# Patient Record
Sex: Male | Born: 1935 | Race: White | Hispanic: No | Smoking: Never smoker
Health system: Southern US, Community
[De-identification: ages and names within clinical notes are randomized; demographics above are authoritative.]

## PROBLEM LIST (undated history)

## (undated) DIAGNOSIS — I255 Ischemic cardiomyopathy: Secondary | ICD-10-CM

## (undated) DIAGNOSIS — E785 Hyperlipidemia, unspecified: Secondary | ICD-10-CM

## (undated) DIAGNOSIS — I2510 Atherosclerotic heart disease of native coronary artery without angina pectoris: Secondary | ICD-10-CM

## (undated) DIAGNOSIS — IMO0001 Reserved for inherently not codable concepts without codable children: Secondary | ICD-10-CM

## (undated) DIAGNOSIS — R0989 Other specified symptoms and signs involving the circulatory and respiratory systems: Secondary | ICD-10-CM

## (undated) DIAGNOSIS — I509 Heart failure, unspecified: Secondary | ICD-10-CM

## (undated) DIAGNOSIS — E669 Obesity, unspecified: Secondary | ICD-10-CM

## (undated) DIAGNOSIS — I5022 Chronic systolic (congestive) heart failure: Secondary | ICD-10-CM

## (undated) HISTORY — DX: Obesity, unspecified: E66.9

## (undated) HISTORY — PX: SHOULDER SURGERY: SHX246

## (undated) HISTORY — PX: KNEE SURGERY: SHX244

## (undated) HISTORY — DX: Heart failure, unspecified: I50.9

## (undated) HISTORY — DX: Atherosclerotic heart disease of native coronary artery without angina pectoris: I25.10

## (undated) HISTORY — PX: APPENDECTOMY: SHX54

## (undated) HISTORY — DX: Other specified symptoms and signs involving the circulatory and respiratory systems: R09.89

## (undated) HISTORY — DX: Reserved for inherently not codable concepts without codable children: IMO0001

## (undated) HISTORY — DX: Hyperlipidemia, unspecified: E78.5

## (undated) HISTORY — DX: Chronic systolic (congestive) heart failure: I50.22

## (undated) HISTORY — PX: BACK SURGERY: SHX140

## (undated) HISTORY — PX: FRACTURE SURGERY: SHX138

## (undated) HISTORY — DX: Ischemic cardiomyopathy: I25.5

---

## 2006-09-10 ENCOUNTER — Ambulatory Visit: Payer: Self-pay | Admitting: General Surgery

## 2007-11-12 ENCOUNTER — Ambulatory Visit: Payer: Self-pay | Admitting: Oncology

## 2007-11-13 ENCOUNTER — Encounter: Payer: Self-pay | Admitting: Cardiovascular Disease

## 2007-11-14 ENCOUNTER — Ambulatory Visit: Payer: Self-pay | Admitting: Oncology

## 2007-11-18 ENCOUNTER — Inpatient Hospital Stay: Payer: Self-pay | Admitting: Internal Medicine

## 2007-11-21 ENCOUNTER — Ambulatory Visit: Payer: Self-pay | Admitting: Thoracic Surgery

## 2007-11-21 ENCOUNTER — Inpatient Hospital Stay (HOSPITAL_COMMUNITY): Admission: AD | Admit: 2007-11-21 | Discharge: 2007-11-28 | Payer: Self-pay | Admitting: Thoracic Surgery

## 2007-11-22 ENCOUNTER — Encounter: Payer: Self-pay | Admitting: Thoracic Surgery

## 2007-11-24 ENCOUNTER — Ambulatory Visit: Payer: Self-pay | Admitting: Internal Medicine

## 2007-12-04 ENCOUNTER — Ambulatory Visit: Payer: Self-pay | Admitting: Thoracic Surgery

## 2007-12-04 ENCOUNTER — Encounter: Admission: RE | Admit: 2007-12-04 | Discharge: 2007-12-04 | Payer: Self-pay | Admitting: Thoracic Surgery

## 2007-12-08 ENCOUNTER — Emergency Department (HOSPITAL_COMMUNITY): Admission: EM | Admit: 2007-12-08 | Discharge: 2007-12-08 | Payer: Self-pay | Admitting: Emergency Medicine

## 2007-12-09 ENCOUNTER — Ambulatory Visit: Payer: Self-pay | Admitting: Thoracic Surgery

## 2007-12-09 ENCOUNTER — Encounter: Admission: RE | Admit: 2007-12-09 | Discharge: 2007-12-09 | Payer: Self-pay | Admitting: Thoracic Surgery

## 2007-12-12 ENCOUNTER — Ambulatory Visit: Payer: Self-pay | Admitting: Oncology

## 2007-12-17 ENCOUNTER — Ambulatory Visit: Payer: Self-pay | Admitting: Thoracic Surgery

## 2007-12-17 ENCOUNTER — Encounter: Admission: RE | Admit: 2007-12-17 | Discharge: 2007-12-17 | Payer: Self-pay | Admitting: Thoracic Surgery

## 2007-12-25 ENCOUNTER — Encounter: Admission: RE | Admit: 2007-12-25 | Discharge: 2007-12-25 | Payer: Self-pay | Admitting: Thoracic Surgery

## 2007-12-25 ENCOUNTER — Ambulatory Visit: Payer: Self-pay | Admitting: Thoracic Surgery

## 2008-01-01 ENCOUNTER — Ambulatory Visit: Payer: Self-pay | Admitting: Thoracic Surgery

## 2008-01-15 ENCOUNTER — Encounter: Admission: RE | Admit: 2008-01-15 | Discharge: 2008-01-15 | Payer: Self-pay | Admitting: Thoracic Surgery

## 2008-01-15 ENCOUNTER — Ambulatory Visit: Payer: Self-pay | Admitting: Thoracic Surgery

## 2008-02-05 ENCOUNTER — Ambulatory Visit: Payer: Self-pay | Admitting: Thoracic Surgery

## 2008-03-16 ENCOUNTER — Encounter: Admission: RE | Admit: 2008-03-16 | Discharge: 2008-03-16 | Payer: Self-pay | Admitting: Surgery

## 2008-03-16 ENCOUNTER — Ambulatory Visit: Payer: Self-pay | Admitting: Thoracic Surgery

## 2009-01-12 IMAGING — CR DG CHEST 2V
2 series · 2 of 2 positions shown · non-contrast
Comparison: 11/28/2007

CLINICAL DATA: Empyema with indwelling chest tubes

CHEST - 2 VIEW

[w chest ap]
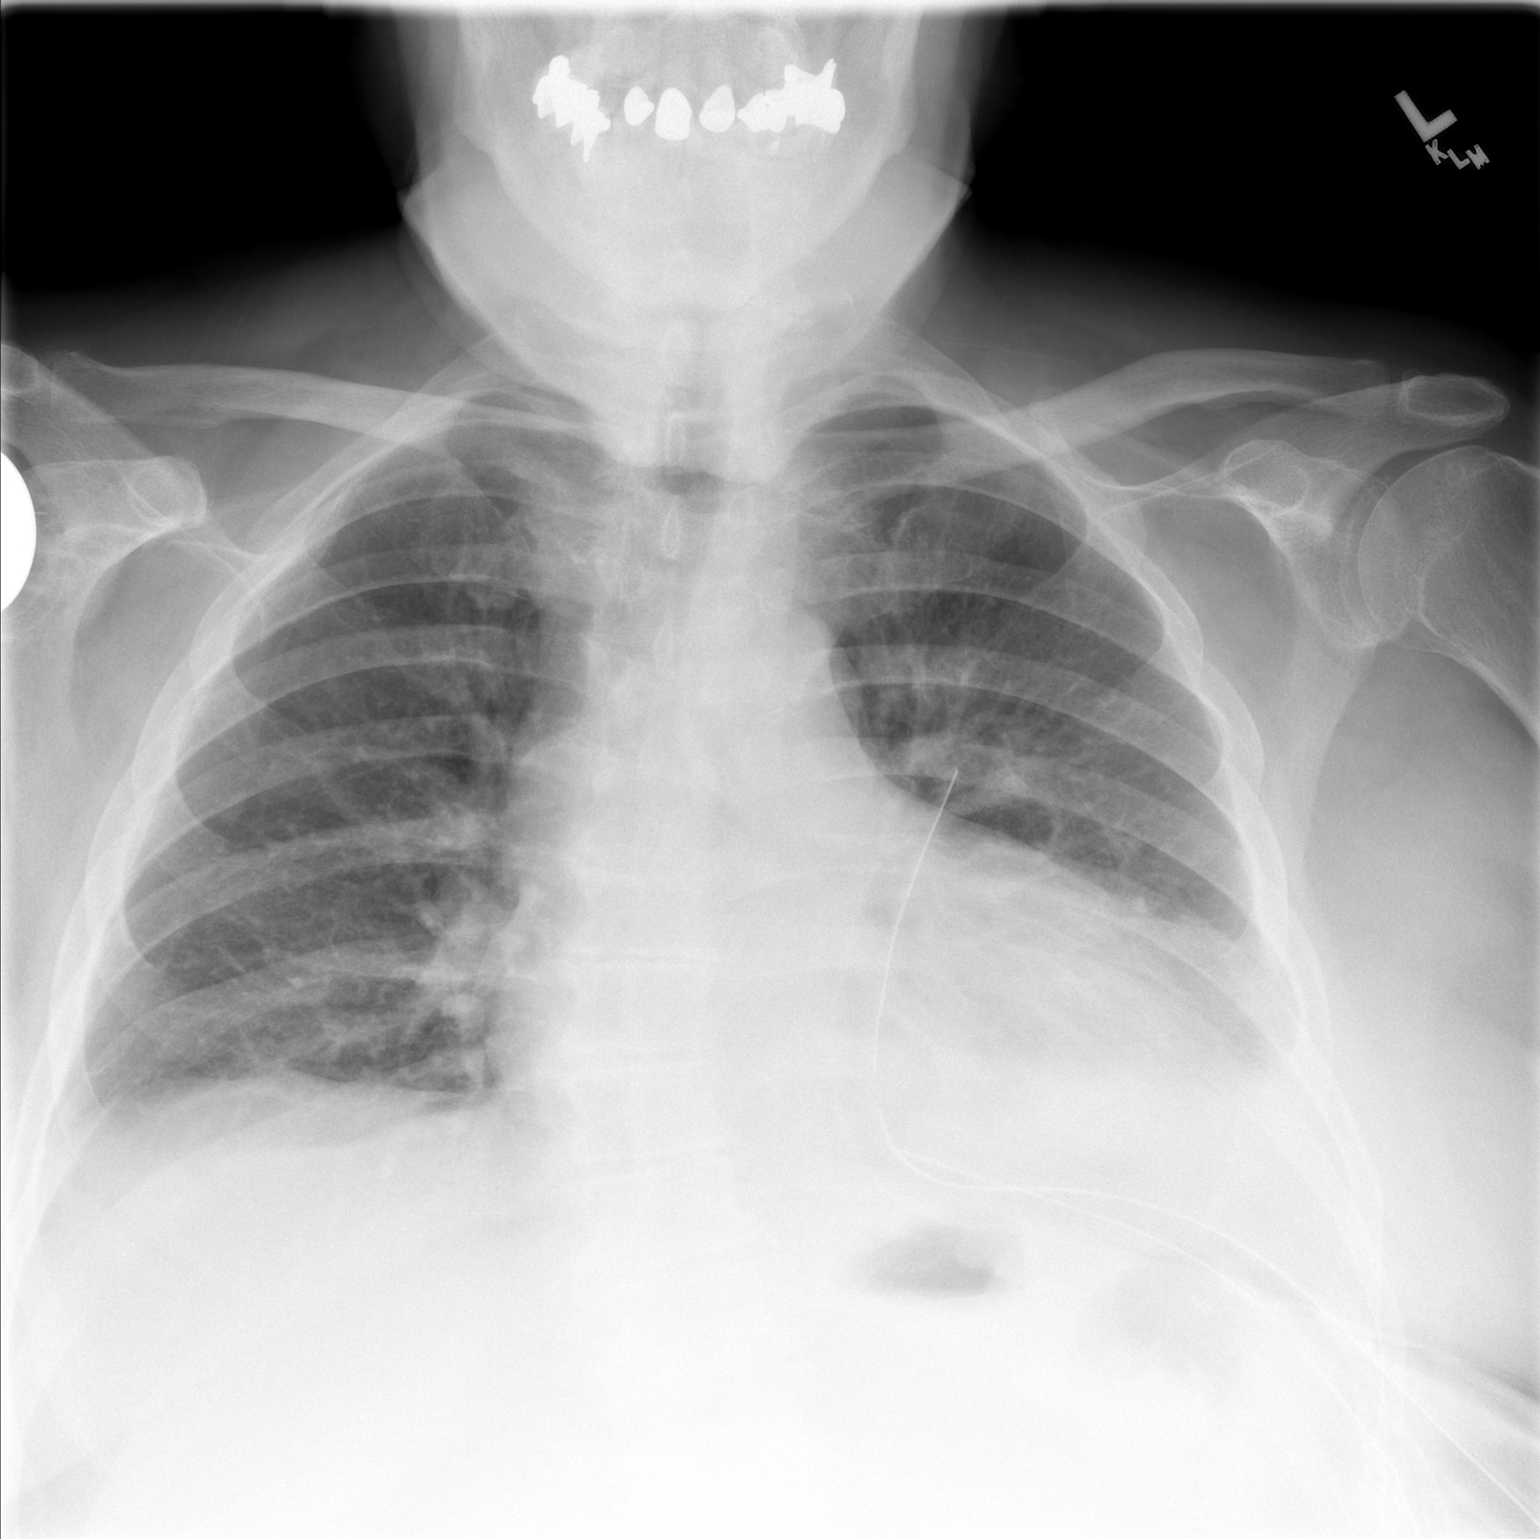

[w chest lat]
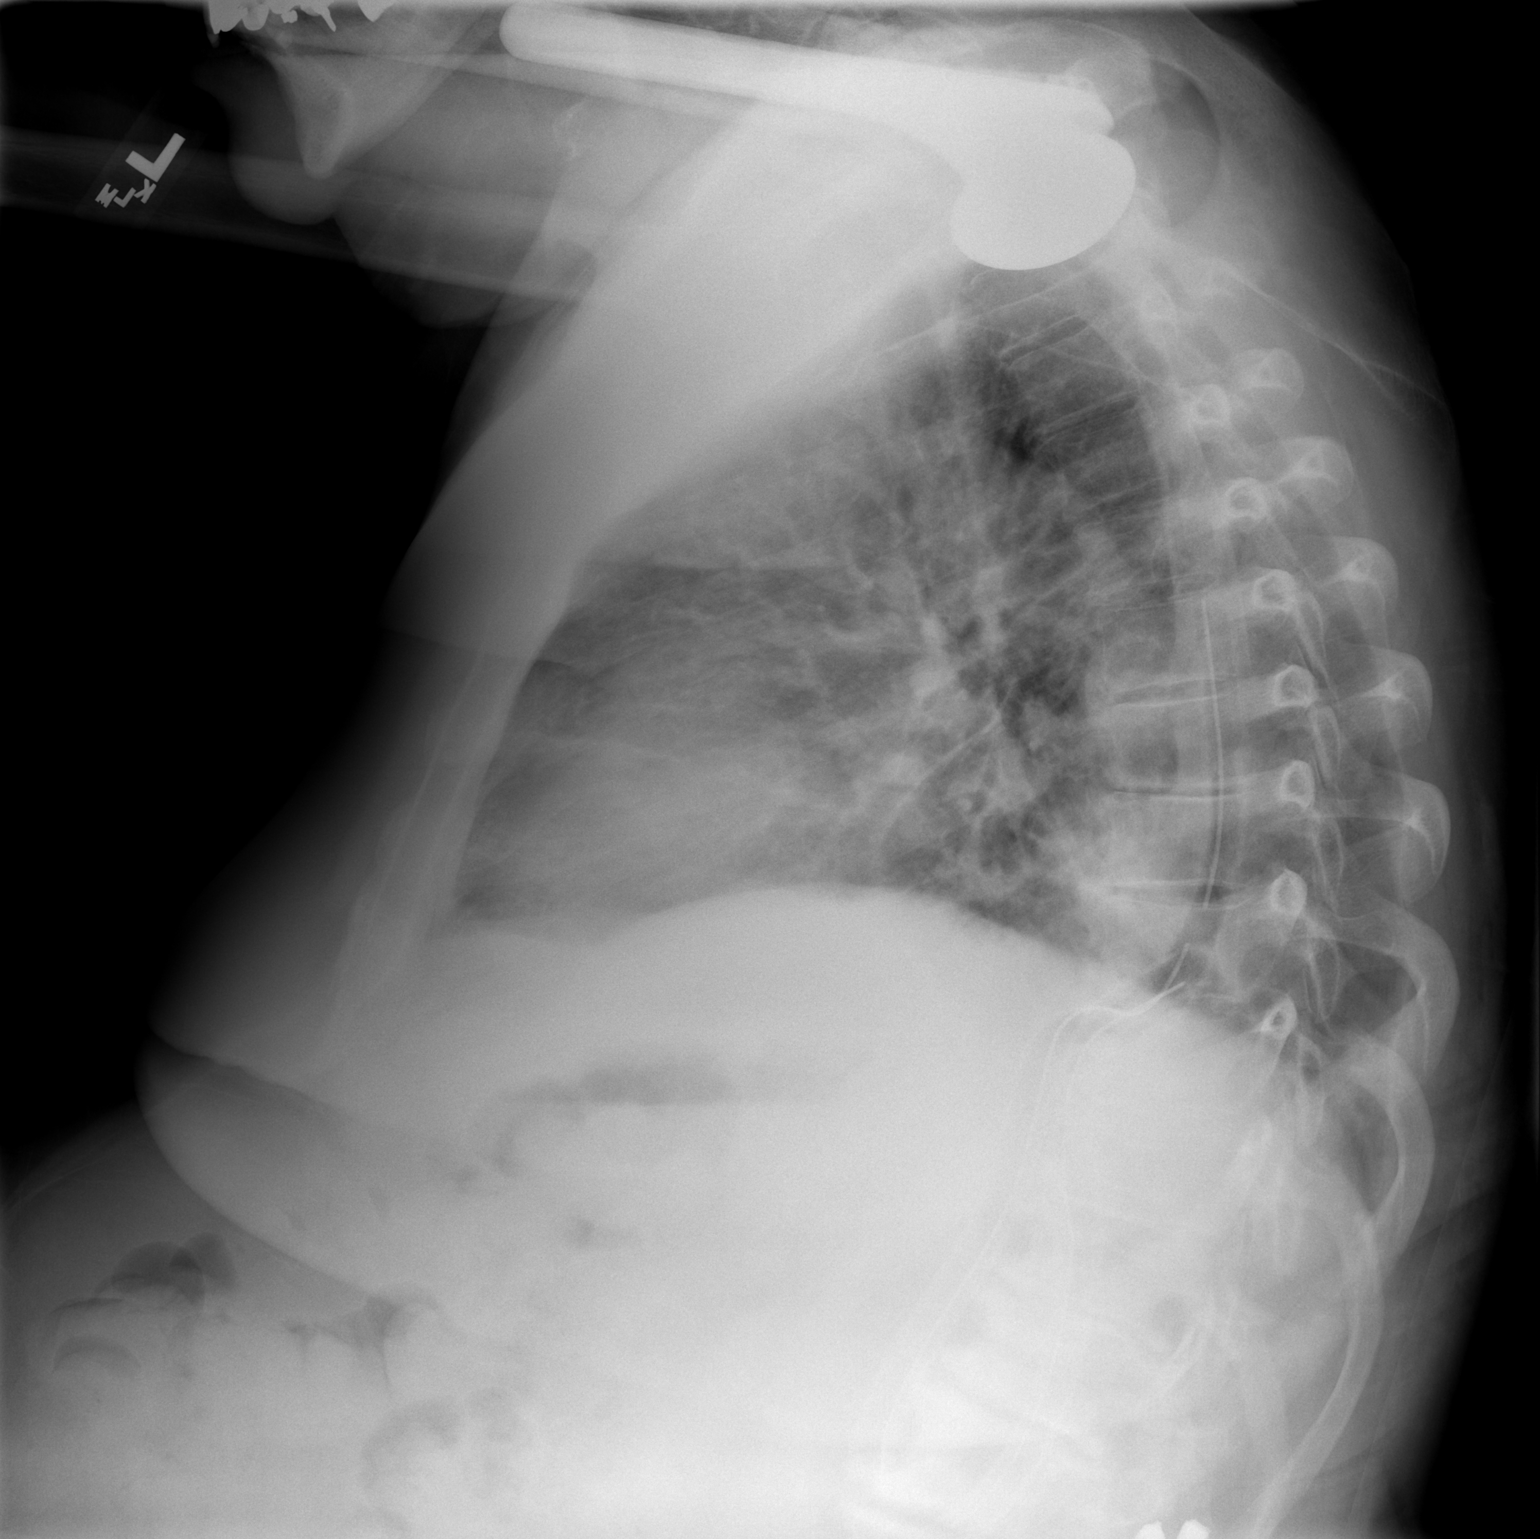

[2 of 2 positions shown; findings below may reference images not displayed]

FINDINGS: To left chest tube remain in place.  No pneumothorax or
significant empyema.  Heart enlarged without definite congestive
heart failure.  No major atelectasis or pneumonia.  Overall, no
significant change.
IMPRESSION: 1.
1.  No pneumothorax thorax or recurrence of empyema with two  left
chest tubes remaining in place.
2.  Heart enlarged - no change.
3.  No new findings.

## 2009-01-17 IMAGING — CR DG CHEST 2V
2 series · 2 of 2 positions shown · non-contrast
Comparison: 12/08/2007

CLINICAL DATA: Chest tube pulled out by accident

CHEST - 2 VIEW

[view not recorded (1 of 2)]
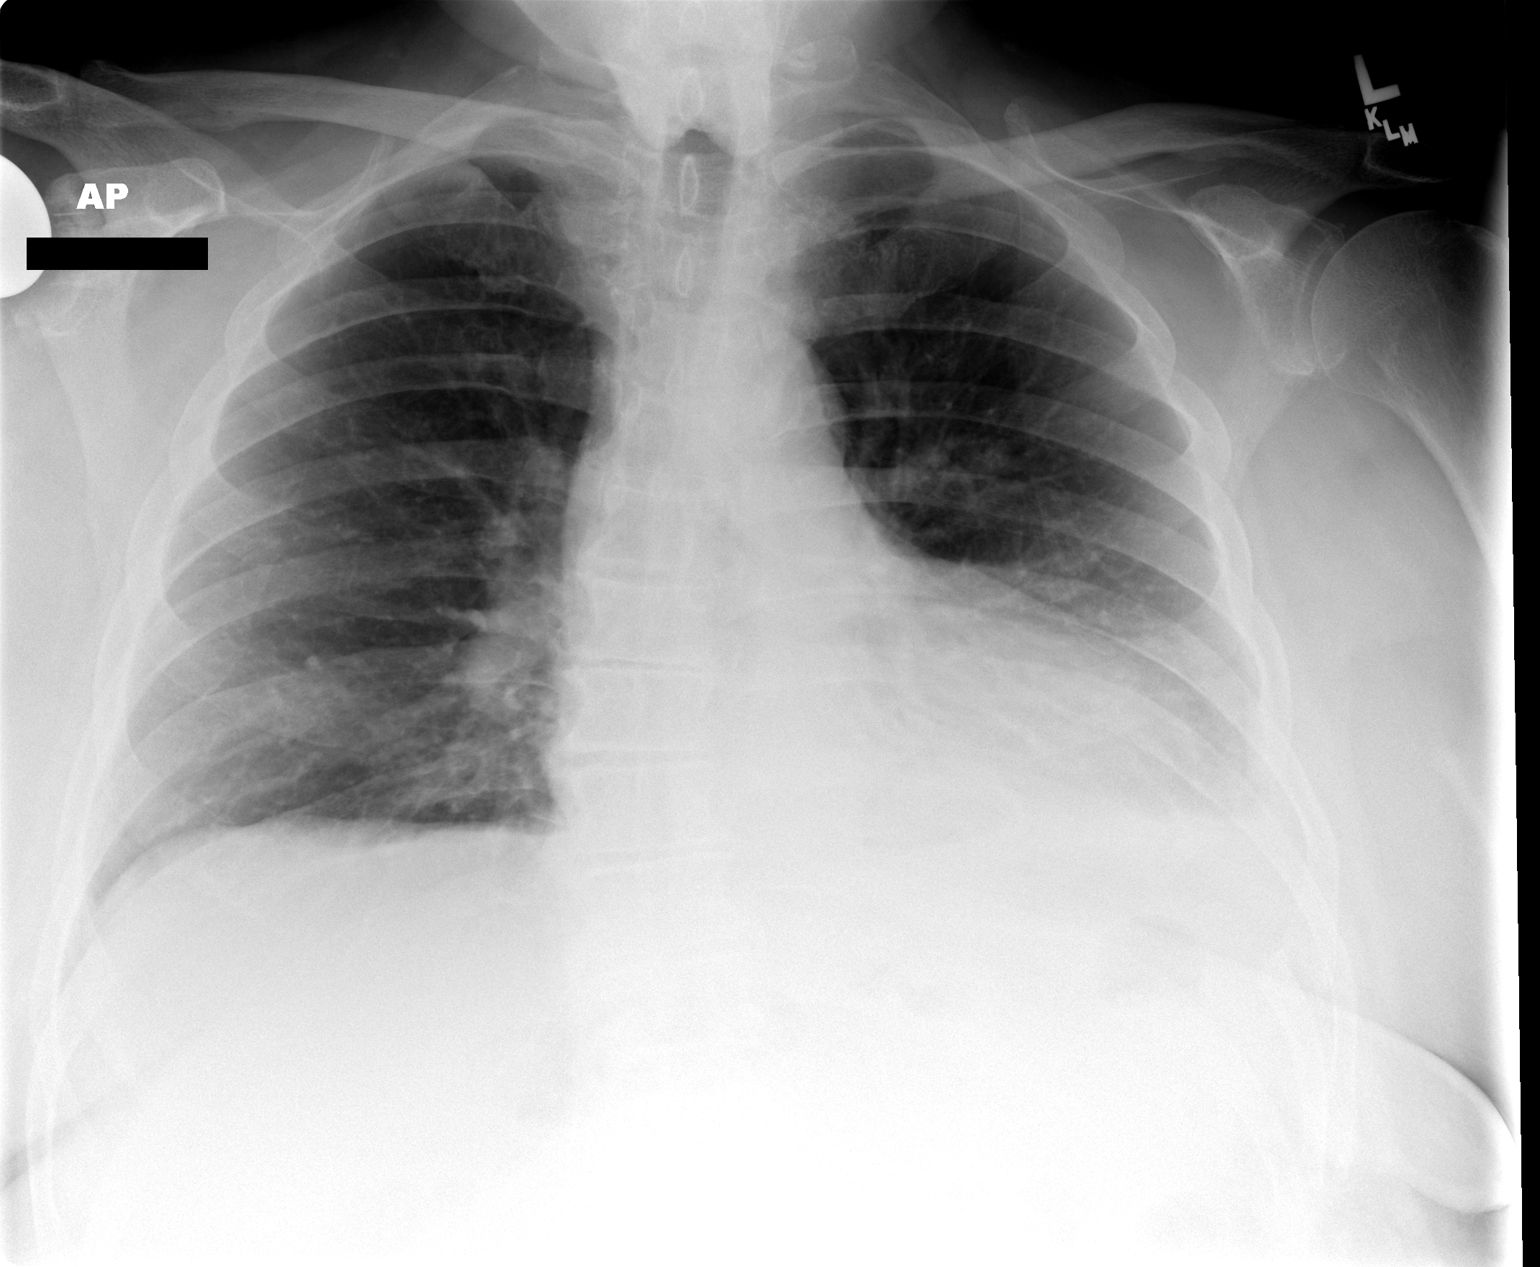

[view not recorded (2 of 2)]
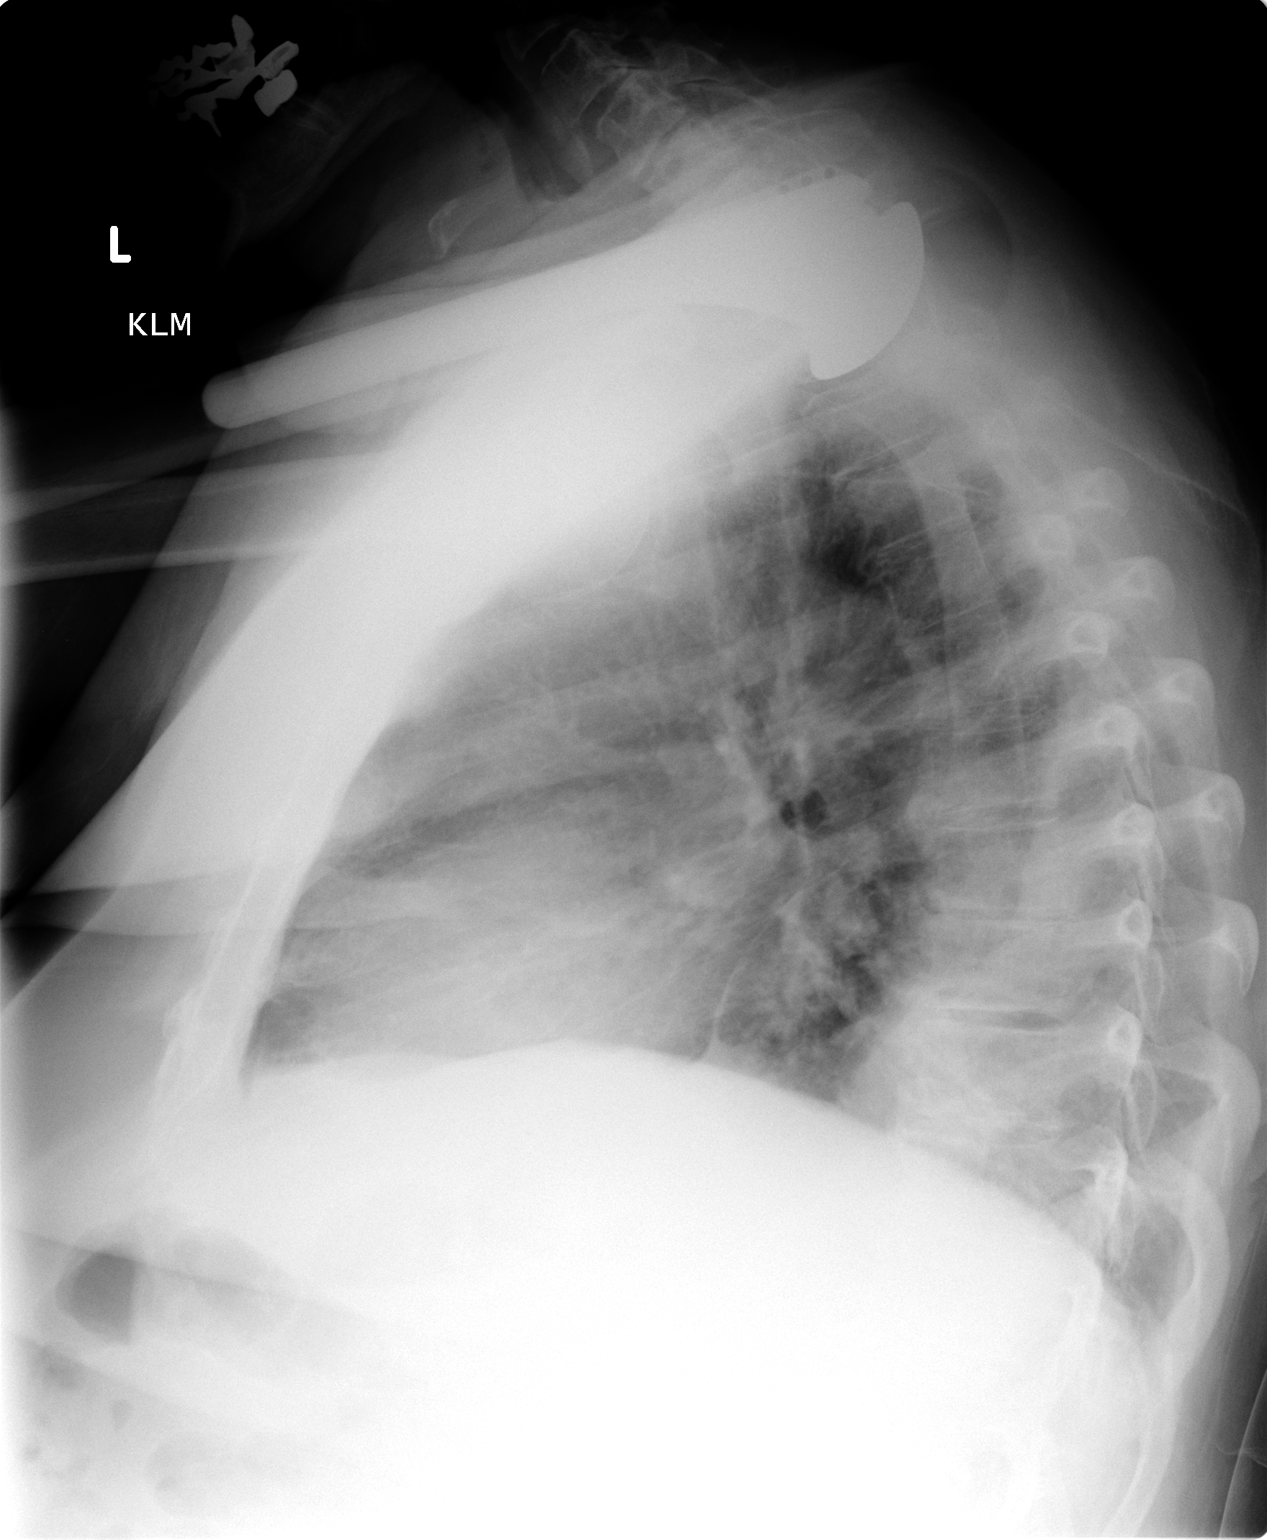

[2 of 2 positions shown; findings below may reference images not displayed]

FINDINGS: Moderate small left pleural effusion.  No pneumothorax.
IMPRESSION: No significant change in left pleural effusion.

## 2009-08-30 ENCOUNTER — Encounter: Payer: Self-pay | Admitting: Cardiovascular Disease

## 2009-12-26 ENCOUNTER — Ambulatory Visit: Payer: Self-pay | Admitting: Internal Medicine

## 2009-12-26 ENCOUNTER — Encounter: Payer: Self-pay | Admitting: Cardiovascular Disease

## 2010-01-25 ENCOUNTER — Encounter: Payer: Self-pay | Admitting: Cardiovascular Disease

## 2010-01-25 ENCOUNTER — Ambulatory Visit: Payer: Self-pay | Admitting: Internal Medicine

## 2010-02-02 ENCOUNTER — Ambulatory Visit: Payer: Self-pay | Admitting: Cardiovascular Disease

## 2010-02-02 DIAGNOSIS — R609 Edema, unspecified: Secondary | ICD-10-CM | POA: Insufficient documentation

## 2010-02-02 DIAGNOSIS — R0602 Shortness of breath: Secondary | ICD-10-CM | POA: Insufficient documentation

## 2010-02-07 ENCOUNTER — Ambulatory Visit: Payer: Self-pay

## 2010-02-07 ENCOUNTER — Encounter: Payer: Self-pay | Admitting: Cardiovascular Disease

## 2010-02-09 ENCOUNTER — Ambulatory Visit: Payer: Self-pay | Admitting: Cardiovascular Disease

## 2010-02-09 DIAGNOSIS — E785 Hyperlipidemia, unspecified: Secondary | ICD-10-CM

## 2010-02-10 ENCOUNTER — Other Ambulatory Visit: Payer: Self-pay | Admitting: Cardiovascular Disease

## 2010-02-10 ENCOUNTER — Encounter: Payer: Self-pay | Admitting: Cardiovascular Disease

## 2010-02-14 HISTORY — PX: CORONARY ARTERY BYPASS GRAFT: SHX141

## 2010-02-15 ENCOUNTER — Ambulatory Visit: Payer: Self-pay | Admitting: Cardiovascular Disease

## 2010-02-15 ENCOUNTER — Inpatient Hospital Stay (HOSPITAL_COMMUNITY): Admission: EM | Admit: 2010-02-15 | Discharge: 2010-02-20 | Payer: Self-pay | Admitting: Internal Medicine

## 2010-02-15 ENCOUNTER — Encounter: Payer: Self-pay | Admitting: Internal Medicine

## 2010-02-15 ENCOUNTER — Ambulatory Visit: Payer: Self-pay | Admitting: Cardiothoracic Surgery

## 2010-02-15 ENCOUNTER — Encounter: Payer: Self-pay | Admitting: Cardiothoracic Surgery

## 2010-02-15 ENCOUNTER — Ambulatory Visit: Payer: Self-pay | Admitting: Internal Medicine

## 2010-02-27 ENCOUNTER — Emergency Department (HOSPITAL_COMMUNITY): Admission: EM | Admit: 2010-02-27 | Discharge: 2010-02-27 | Payer: Self-pay | Admitting: Emergency Medicine

## 2010-02-27 ENCOUNTER — Encounter (INDEPENDENT_AMBULATORY_CARE_PROVIDER_SITE_OTHER): Payer: Self-pay | Admitting: Emergency Medicine

## 2010-03-01 ENCOUNTER — Encounter: Admission: RE | Admit: 2010-03-01 | Discharge: 2010-03-01 | Payer: Self-pay | Admitting: Cardiothoracic Surgery

## 2010-03-01 ENCOUNTER — Ambulatory Visit: Payer: Self-pay | Admitting: Cardiothoracic Surgery

## 2010-03-08 ENCOUNTER — Ambulatory Visit: Payer: Self-pay | Admitting: Cardiothoracic Surgery

## 2010-03-09 ENCOUNTER — Ambulatory Visit: Payer: Self-pay | Admitting: Cardiovascular Disease

## 2010-03-09 DIAGNOSIS — Z951 Presence of aortocoronary bypass graft: Secondary | ICD-10-CM | POA: Insufficient documentation

## 2010-03-13 ENCOUNTER — Encounter: Payer: Self-pay | Admitting: Cardiovascular Disease

## 2010-03-16 ENCOUNTER — Encounter: Payer: Self-pay | Admitting: Cardiovascular Disease

## 2010-03-17 ENCOUNTER — Encounter: Payer: Self-pay | Admitting: Cardiovascular Disease

## 2010-03-29 ENCOUNTER — Encounter: Admission: RE | Admit: 2010-03-29 | Discharge: 2010-03-29 | Payer: Self-pay | Admitting: Cardiothoracic Surgery

## 2010-03-29 ENCOUNTER — Ambulatory Visit: Payer: Self-pay | Admitting: Cardiothoracic Surgery

## 2010-03-29 ENCOUNTER — Encounter: Payer: Self-pay | Admitting: Cardiovascular Disease

## 2010-04-26 ENCOUNTER — Ambulatory Visit: Payer: Self-pay | Admitting: Cardiothoracic Surgery

## 2010-05-23 ENCOUNTER — Ambulatory Visit: Payer: Self-pay | Admitting: Cardiovascular Disease

## 2010-05-23 DIAGNOSIS — I10 Essential (primary) hypertension: Secondary | ICD-10-CM | POA: Insufficient documentation

## 2010-06-02 ENCOUNTER — Encounter: Payer: Self-pay | Admitting: Cardiovascular Disease

## 2010-08-28 ENCOUNTER — Ambulatory Visit
Admission: RE | Admit: 2010-08-28 | Discharge: 2010-08-28 | Payer: Self-pay | Source: Home / Self Care | Attending: Cardiovascular Disease | Admitting: Cardiovascular Disease

## 2010-08-29 ENCOUNTER — Encounter: Payer: Self-pay | Admitting: Cardiovascular Disease

## 2010-08-29 ENCOUNTER — Ambulatory Visit
Admission: RE | Admit: 2010-08-29 | Discharge: 2010-08-29 | Payer: Self-pay | Source: Home / Self Care | Attending: Cardiovascular Disease | Admitting: Cardiovascular Disease

## 2010-08-29 ENCOUNTER — Telehealth (INDEPENDENT_AMBULATORY_CARE_PROVIDER_SITE_OTHER): Payer: Self-pay | Admitting: *Deleted

## 2010-08-30 LAB — CONVERTED CEMR LAB
BUN: 29 mg/dL — ABNORMAL HIGH (ref 6–23)
Bilirubin, Direct: 0.1 mg/dL (ref 0.0–0.3)
CO2: 25 meq/L (ref 19–32)
Chloride: 105 meq/L (ref 96–112)
Glucose, Bld: 107 mg/dL — ABNORMAL HIGH (ref 70–99)
Indirect Bilirubin: 0.4 mg/dL (ref 0.0–0.9)
LDL Cholesterol: 154 mg/dL — ABNORMAL HIGH (ref 0–99)
Potassium: 4.3 meq/L (ref 3.5–5.3)
Total Bilirubin: 0.5 mg/dL (ref 0.3–1.2)
Total Protein: 6.9 g/dL (ref 6.0–8.3)
VLDL: 27 mg/dL (ref 0–40)

## 2010-09-12 NOTE — Cardiovascular Report (Signed)
Summary: Cardiac Cath Other  Cardiac Cath Other   Imported By: West Carbo 02/15/2010 14:44:02  _____________________________________________________________________  External Attachment:    Type:   Image     Comment:   External Document

## 2010-09-12 NOTE — Letter (Signed)
Summary: Pt at Home BP Readings  Pt at Home BP Readings   Imported By: Harlon Flor 06/02/2010 15:41:41  _____________________________________________________________________  External Attachment:    Type:   Image     Comment:   External Document  Appended Document: Pt at Home BP Readings Preliminarily reviewed. Forwarded to MD desktop for review and signature   Appended Document: Pt at Home BP Readings blood pressure numbers are great  Appended Document: Pt at Home BP Readings LMOM TCB  Appended Document: Pt at Home BP Readings pt aware

## 2010-09-12 NOTE — Assessment & Plan Note (Signed)
Summary: ROV/ALT   Visit Type:  Follow-up Primary Provider:  Dorian Furnace, M.D.  CC:  Denies chest pain or shortness of breath. Does have some swelling in legs and feet and ankles..  History of Present Illness: 77 year old male patient of Dr. Arlana Pouch, with a history of obesity, hip and knee discomfort, shortness of breath starting in May which improved with diuretic and long-acting nitroglycerin, history of hypertension and hyperlipidemia, CT scan from 2 years ago suggesting coronary and aortic calcification who presented for shortness of breath and edema on his last visit, s/p cardiac cath at [facility] showing critical left main disease, now s/p urgent CABG. 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 He presents for routine followup.  he has recovered very well from his long hospital course and bypass surgery. He walks on a daily basis for 30 minutes and has been doing so for at least one month. He does have chronic edema bilaterally. Blood pressure at home is typically in the 130/80 range.  echocardiogram prior to CABG shoed ejection fraction of 35%, anterior wall hypokinesis concerning for underlying ischemia or coronary artery disease.  Previous EKG shows normal sinus rhythm with rate 83 beats per minute, T-wave inversions in leads one, aVL, left axis deviation.  Current Medications (verified): 1)  Omeprazole 20 Mg Cpdr (Omeprazole) .... Once Daily 2)  Aspir-Low 81 Mg Tbec (Aspirin) .... One Tablet Once Daily 3)  Lisinopril 20 Mg Tabs (Lisinopril) .... Take One Tablet By Mouth Daily 4)  Coreg 6.25 Mg Tabs (Carvedilol) .Marland Kitchen.. 1 Tablet Two Times A Day 5)  Pravachol 40 Mg Tabs (Pravastatin Sodium) .... One Tablet At Bedtime 6)  Furosemide 40 Mg Tabs (Furosemide) .... One Tablet Once Daily 7)  Naprosyn 500 Mg Tabs (Naproxen) .... One Tablet Once Daily  Allergies (verified): 1)  ! Pcn  Past History:  Past Medical History: Last updated:  02/02/2010 Hyperlipidemia Hypertension mild CHF  Past Surgical History: Last updated: 03/09/2010 Back surg. x 2 Righ shoulder surg. Left knee surg. Right knee surg CABG x 3 February 14, 2010  Family History: Last updated: 02/02/2010 Negative FH of Diabetes, Hypertension, or Coronary Artery Disease  Social History: Last updated: 02/02/2010 Tobacco Use - No.  Alcohol Use - no Regular Exercise - no Drug Use - no Retired .[employer] Married   Risk Factors: Exercise: no (02/02/2010)  Risk Factors: Smoking Status: never (02/02/2010)  Review of Systems       The patient complains of dyspnea on exertion and peripheral edema.  The patient denies fever, weight loss, weight gain, vision loss, decreased hearing, hoarseness, chest pain, syncope, prolonged cough, abdominal pain, incontinence, muscle weakness, depression, and enlarged lymph nodes.    Vital Signs:  Patient profile:   77 year old male Height:      66 inches Weight:      246 pounds BMI:     39.85 Pulse rate:   61 / minute BP sitting:   180 / 82  (left arm) Cuff size:   large  Vitals Entered By: Bishop Dublin, CMA (May 23, 2010 9:53 AM)  Physical Exam  General:  Well developed, well nourished, in no acute distress. Obese Head:  normocephalic and atraumatic Neck:  Neck supple, no JVD. No masses, thyromegaly or abnormal cervical nodes. Lungs:  Clear bilaterally to auscultation and percussion. Heart:  Non-displaced PMI, chest non-tender; regular rate and rhythm, S1, S2 without murmurs, rubs or gallops. Carotid upstroke  normal, no bruit.  Pedals normal pulses. 1+ edema b/l LE, no varicosities. Abdomen:  Bowel sounds positive; abdomen soft and non-tender without masses, obese Msk:  Back normal, normal gait. Muscle strength and tone normal. Pulses:  pulses normal in all 4 extremities Extremities:  No clubbing or cyanosis. Neurologic:  Alert and oriented x 3. Skin:  Intact without lesions or rashes. Psych:   Normal affect.   Impression & Recommendations:  Problem # 1:  HYPERTENSION, BENIGN (ICD-401.1) his blood pressure is very elevated today with systolics in the 180s bilaterally. I am concerned that his home blood pressure cuff which is a wrist cuff may not be particularly accurate. We will add clonidine 0.1 mg b.i.d. for his hypertension. I've asked him to come back in 2 weeks for a blood pressure check in the office. He reports that he does not have close followup with Dr. Quillian Quince in [place] for some time. I am hesitant to start a calcium channel blocker such as amlodipine given his significant lower extremity edema.  His updated medication list for this problem includes:    Aspir-low 81 Mg Tbec (Aspirin) ..... One tablet once daily    Lisinopril 20 Mg Tabs (Lisinopril) .Marland Kitchen... Take one tablet by mouth daily    Coreg 6.25 Mg Tabs (Carvedilol) .Marland Kitchen... Take 1 tablet by mouth two times a day    Furosemide 40 Mg Tabs (Furosemide) .Marland Kitchen... Take 1 tablet by mouth once a day    Clonidine Hcl 0.1 Mg Tabs (Clonidine hcl) .Marland Kitchen... Take one tablet by mouth twice a day  Problem # 2:  EDEMA (ICD-782.3) I suspect that his edema, which has been chronic, maybe secondary to chronic venous insufficiency. He is unable to put TED hose in place himself. He is on a diuretic with no significant improvement of his edema.  Problem # 3:  CORONARY ARTERY BYPASS GRAFT, THREE VESSEL, HX OF (ICD-V45.81) he has recovered well from his bypass surgery. I encouraged him to continue his walking as he is doing on a daily basis. We will need to continue aggressive lipid management.  Problem # 4:  HYPERLIPIDEMIA-MIXED (ICD-272.4) we'll try to obtain his most recent lipid panel from Dr. Quillian Quince. Goal LDL is less than 70, total cholesterol less than 150  The following medications were removed from the medication list:    Crestor 20 Mg Tabs (Rosuvastatin calcium) .Marland Kitchen... Take one tablet by mouth daily. His updated medication list for this  problem includes:    Pravachol 40 Mg Tabs (Pravastatin sodium) .Marland Kitchen... Take 1 tablet by mouth once a day  Patient Instructions: 1)  Your physician wants you to follow-up in:   6 months You will receive a reminder letter in the mail two months in advance. If you don't receive a letter, please call our office to schedule the follow-up appointment. 2)  Your physician has requested that you regularly monitor and record your blood pressure readings at home.  Please call our office with your recordings.  3)  Your physician has recommended you make the following change in your medication: START clonodine 0.1mg  two times a day  Prescriptions: CLONIDINE HCL 0.1 MG TABS (CLONIDINE HCL) Take one tablet by mouth twice a day  #60 x 6   Entered by:   Benedict Needy, RN   Authorized by:   Dossie Arbour MD   Signed by:   Benedict Needy, RN on 05/23/2010   Method used:   Electronically to        [facility]  [place]. [phone]* (retail)       [address] [place], [place]  [address]       Ph: [phone]       Fax: [phone]   RxID:   [id number] FUROSEMIDE 40 MG TABS (FUROSEMIDE) Take 1 tablet by mouth once a day  #30 x 6   Entered by:   Benedict Needy, RN   Authorized by:   Dossie Arbour MD   Signed by:   Benedict Needy, RN on 05/23/2010   Method used:   Electronically to        [facility] [phone]* (retail)       [address]       [place], [place]  [address]       Ph: [phone]       Fax: [phone]   RxID:   [id number] PRAVACHOL 40 MG TABS (PRAVASTATIN SODIUM) Take 1 tablet by mouth once a day  #30 x 6   Entered by:   Benedict Needy, RN   Authorized by:   Dossie Arbour MD   Signed by:   Benedict Needy, RN on 05/23/2010   Method used:   Electronically to        [facility] [phone]* (retail)       [address]       [place], [place]  [address]       Ph: [phone]       Fax: [phone]   RxID:    [id number] COREG 6.25 MG TABS (CARVEDILOL) Take 1 tablet by mouth two times a day  #60 x 6   Entered by:   Benedict Needy, RN   Authorized by:   Dossie Arbour MD   Signed by:   Benedict Needy, RN on 05/23/2010   Method used:   Electronically to        [facility] [phone]* (retail)       [address]       [place], [place]  [address]       Ph: [phone]       Fax: [phone]   RxID:   [id number] LISINOPRIL 20 MG TABS (LISINOPRIL) Take one tablet by mouth daily  #30 x 6   Entered by:   Benedict Needy, RN   Authorized by:   Dossie Arbour MD   Signed by:   Benedict Needy, RN on 05/23/2010   Method used:   Electronically to        [facility] [phone]* (retail)       [address] [place], [place]  [address]       Ph: [phone]       Fax: [phone]   RxID:   [id number]

## 2010-09-12 NOTE — Consult Note (Signed)
Summary: [facility]  [facility]   Imported By: Harlon Flor 04/05/2010 16:36:19  _____________________________________________________________________  External Attachment:    Type:   Image     Comment:   External Document

## 2010-09-12 NOTE — Miscellaneous (Signed)
Summary: Crestor  Clinical Lists Changes  Medications: Changed medication from LOVASTATIN 40 MG TABS (LOVASTATIN) Take one tablet by mouth daily at bedtime to CRESTOR 20 MG TABS (ROSUVASTATIN CALCIUM) Take one tablet by mouth daily.

## 2010-09-12 NOTE — Letter (Signed)
Summary: Pt at Home BP Readings  Pt at Home BP Readings   Imported By: Harlon Flor 06/02/2010 08:06:50  _____________________________________________________________________  External Attachment:    Type:   Image     Comment:   External Document

## 2010-09-12 NOTE — Assessment & Plan Note (Signed)
Summary: F/U 1 MONTH   Visit Type:  Follow-up Primary Provider:  [facility] TATE,M.D.  CC:  c/o shortness of breath with little exertion.Marland Kitchen  History of Present Illness: 77 year old male patient of Dr. Arlana Pouch, with a history of obesity, hip and knee discomfort, shortness of breath starting in May which improved with diuretic and long-acting nitroglycerin, history of hypertension and hyperlipidemia, CT scan from 2 years ago suggesting coronary and aortic calcification presenting for evaluation of his shortness of breath and edema. 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  echocardiogram showed ejection fraction of 35%, anterior wall hypokinesis concerning for underlying ischemia or coronary artery disease.  His lower extremity edema and shortness of breath has persisted and he is quite uncomfortable. He has difficulty walking due to his breathing. He reports that this came on suddenly. He did have Lasix and Imdur for a short period of time so this ran out and he did not have any refills.  Chest x-ray in May 16 showed mild pulmonary venous congestion, mild interstitial edema. Chest x-ray June 15 showed improvement of his edema. This was after Lasix.  EKG shows normal sinus rhythm with rate 76 beats per minute, rare APC, no significant ST changes. He does have T wave inversion in leads one and aVL. Left axis deviation, left anterior fascicular block.  Current Medications (verified): 1)  Lovastatin 40 Mg Tabs (Lovastatin) .... Take One Tablet By Mouth Daily At Bedtime 2)  Verapamil Hcl 80 Mg Tabs (Verapamil Hcl) .... Three Times A Day 3)  Prazosin Hcl 2 Mg Caps (Prazosin Hcl) .... Three Times A Day 4)  Omeprazole 20 Mg Cpdr (Omeprazole) .... Once Daily 5)  Naprosyn 500 Mg Tabs (Naproxen) .... Two Times A Day 6)  Isosorbide Mononitrate Cr 30 Mg Xr24h-Tab (Isosorbide Mononitrate) .Marland Kitchen.. 1 Tablet Once Daily 7)  Lasix 20 Mg Tabs (Furosemide) .Marland Kitchen.. 1 Tablet  Two Times A Day As Needed For Swelling 8)  Klor-Con M20 20 Meq Cr-Tabs (Potassium Chloride Crys Cr) .Marland Kitchen.. 1 Once Daily As Needed With Lasix(Furosemide) 9)  Aspirin 81 Mg Tbec (Aspirin) .... Take Two Tablet By Mouth Daily  Allergies (verified): 1)  ! Pcn  Past History:  Past Medical History: Last updated: 02/02/2010 Hyperlipidemia Hypertension mild CHF  Past Surgical History: Last updated: 02/02/2010 Back surg. x 2 Righ shoulder surg. Left knee surg. Right knee surg.  Family History: Last updated: 02/02/2010 Negative FH of Diabetes, Hypertension, or Coronary Artery Disease  Social History: Last updated: 02/02/2010 Tobacco Use - No.  Alcohol Use - no Regular Exercise - no Drug Use - no Retired .[employer] Married   Risk Factors: Exercise: no (02/02/2010)  Risk Factors: Smoking Status: never (02/02/2010)  Review of Systems       The patient complains of weight gain, dyspnea on exertion, and peripheral edema.  The patient denies fever, weight loss, vision loss, decreased hearing, hoarseness, chest pain, syncope, prolonged cough, abdominal pain, incontinence, muscle weakness, depression, and enlarged lymph nodes.    Vital Signs:  Patient profile:   77 year old male Height:      66 inches Weight:      268 pounds BMI:     43.41 Pulse rate:   99 / minute BP sitting:   153 / 83  (left arm) Cuff size:   large  Vitals Entered By: Bishop Dublin, CMA (February 09, 2010 3:13 PM)  Physical Exam  General:  Well developed, well  nourished, in no acute distress. Head:  normocephalic and atraumatic Neck:  Neck supple, no JVD. No masses, thyromegaly or abnormal cervical nodes. Chest Wall:  no deformities or breast masses noted Lungs:  Clear bilaterally to auscultation and percussion. Heart:  Non-displaced PMI, chest non-tender; regular rate and rhythm, S1, S2 without murmurs, rubs or gallops. Carotid upstroke normal, no bruit.  Pedals normal pulses. 2+ edema b/l LE, no  varicosities. Abdomen:  Bowel sounds positive; abdomen soft and non-tender without masses Msk:  Back normal, normal gait. Muscle strength and tone normal. Pulses:  pulses normal in all 4 extremities Extremities:  No clubbing or cyanosis. Neurologic:  Alert and oriented x 3. Skin:  Intact without lesions or rashes. Psych:  Normal affect.   Impression & Recommendations:  Problem # 1:  SHORTNESS OF BREATH (ICD-786.05) Mr. [name] has a cardiomyopathy, moderately depressed systolic function, ejection fraction 35% with wall motion abnormality noted concerning for underlying coronary artery disease. He does have a CT scan of the chest suggesting atherosclerosis of the aorta and coronary arteries. His shortness of breath is likely due to heart failure. We will restart his Lasix 20 mg daily with a second dose in the afternoon for worsening shortness of breath. We will also restart his Imdur 30 mg daily. He will take potassium with his Lasix.  We will set him up for a cardiac catheterization as well as right heart catheter to evaluate his venous pressures.  His updated medication list for this problem includes:    Verapamil Hcl 80 Mg Tabs (Verapamil hcl) .Marland Kitchen... Three times a day    Lasix 20 Mg Tabs (Furosemide) .Marland Kitchen... 1 tablet two times a day as needed for swelling    Aspirin 81 Mg Tbec (Aspirin) .Marland Kitchen... Take two tablet by mouth daily  Problem # 2:  HYPERLIPIDEMIA-MIXED (ICD-272.4) given his underlying coronary artery disease, we'll try to obtain his most recent lipid panel. Goal LDL should be less than 70.  His updated medication list for this problem includes:    Lovastatin 40 Mg Tabs (Lovastatin) .Marland Kitchen... Take one tablet by mouth daily at bedtime  Problem # 3:  EDEMA (ICD-782.3) His significant edema bilaterally is likely due to underlying heart failure. He reports that this did improve previously on Lasix. Will have him track his weight on lasix.  Patient Instructions: 1)  Your physician  recommends that you schedule a follow-up appointment in: 1 month. 2)  Your physician has recommended you make the following change in your medication: Start aspirin 81 mg take two tablets everyday, Lasix 20 mg one tablet two times a day as needed with KCL 20 meq twice a day when taking Lasix only and Isosorbide Mono 30 mg one tablet everyday. 3)  Your physician has requested that you have a cardiac catheterization.  Cardiac catheterization is used to diagnose and/or treat various heart conditions. Doctors may recommend this procedure for a number of different reasons. The most common reason is to evaluate chest pain. Chest pain can be a symptom of coronary artery disease (CAD), and cardiac catheterization can show whether plaque is narrowing or blocking your heart's arteries. This procedure is also used to evaluate the valves, as well as measure the blood flow and oxygen levels in different parts of your heart.  For further information please visit [email or web address]  Please follow instruction sheet, as given. Prescriptions: KLOR-CON M20 20 MEQ CR-TABS (POTASSIUM CHLORIDE CRYS CR) 1 once daily as needed with Lasix(Furosemide)  #30 x 1   Entered by:  Benedict Needy, RN   Authorized by:   Dossie Arbour MD   Signed by:   Benedict Needy, RN on 02/09/2010   Method used:   Electronically to        [facility] [phone]* (retail)       [address] [place], [place]  [address]       Ph: [phone]       Fax: [phone]   RxID:   [id number] LASIX 20 MG TABS (FUROSEMIDE) 1 tablet two times a day as needed for swelling  #60 x 1   Entered by:   Benedict Needy, RN   Authorized by:   Dossie Arbour MD   Signed by:   Benedict Needy, RN on 02/09/2010   Method used:   Electronically to        [facility] [phone]* (retail)       [address]       [place], [place]  [address]       Ph: [phone]       Fax: [phone]   RxID:    [id number] ISOSORBIDE MONONITRATE CR 30 MG XR24H-TAB (ISOSORBIDE MONONITRATE) 1 tablet once daily  #30 x 3   Entered by:   Benedict Needy, RN   Authorized by:   Dossie Arbour MD   Signed by:   Benedict Needy, RN on 02/09/2010   Method used:   Electronically to        [facility] [phone]* (retail)       [address] [place], [place]  [address]       Ph: [phone]       Fax: [phone]   RxID:   [id number]

## 2010-09-12 NOTE — Miscellaneous (Signed)
[facility]   Imported By: West Carbo 03/16/2010 14:40:45  _____________________________________________________________________  External Attachment:    Type:   Image     Comment:   External Document  Appended Document: Nursing Home Preliminarily reviewed. Forwarded to MD desktop for review and signature

## 2010-09-12 NOTE — Assessment & Plan Note (Signed)
Summary: NP6/AMD   Visit Type:  Initial Consult Primary Provider:  [facility] TATE,M.D.  CC:  shortness of breath, swelling in legs, and feet and ankles..  History of Present Illness: 77 year old male with a history of obesity, hip and knee discomfort, shortness of breath starting in May which improved with diuretic and long-acting nitroglycerin, also with history of hypertension and hyperlipidemia, CT scan from 2 years ago suggesting coronary and aortic calcification presenting for evaluation of his shortness of breath and edema.  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  [name] that on a diuretic and Imdur, his shortness of breath did improve somewhat. He is not taking these currently. When he exerts himself, he has some shortness of breath, particular after he sits down. He feels there is a delay in the shortness of breath and it hits him several seconds after he sits. The edema has been chronic but did seem to improve with diuretic.  he denies any chest pain, lightheadedness, dizziness. No significant sweating with exertion. 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  Chest x-ray in May 16 showed mild pulmonary venous congestion, mild interstitial edema. Chest x-ray June 15 showed improvement of his edema. This was after Lasix.  EKG shows normal sinus rhythm with rate 76 beats per minute, rare APC, no significant ST changes. He does have T wave inversion in leads one and aVL. Left axis deviation, left anterior fascicular block.  Preventive Screening-Counseling & Management  Alcohol-Tobacco     Smoking Status: never  Caffeine-Diet-Exercise     Does Patient Exercise: no      Drug Use:  no.    Current Medications (verified): 1)  Lovastatin 40 Mg Tabs (Lovastatin) .... Take One Tablet By Mouth Daily At Bedtime 2)  Verapamil Hcl 80 Mg Tabs (Verapamil Hcl) .... Three Times A Day 3)  Prazosin Hcl 2 Mg  Caps (Prazosin Hcl) .... Three Times A Day 4)  Omeprazole 20 Mg Cpdr (Omeprazole) .... Once Daily 5)  Naprosyn 500 Mg Tabs (Naproxen) .... Two Times A Day  Allergies (verified): 1)  ! Pcn  Past History:  Past Medical History: Last updated: 02/02/2010 Hyperlipidemia Hypertension mild CHF  Family History: Last updated: 02/02/2010 Negative FH of Diabetes, Hypertension, or Coronary Artery Disease  Social History: Last updated: 02/02/2010 Tobacco Use - No.  Alcohol Use - no Regular Exercise - no Drug Use - no Retired .[employer] Married   Risk Factors: Exercise: no (02/02/2010)  Risk Factors: Smoking Status: never (02/02/2010)  Past Surgical History: Back surg. x 2 Righ shoulder surg. Left knee surg. Right knee surg.  Family History: Negative FH of Diabetes, Hypertension, or Coronary Artery Disease  Social History: Tobacco Use - No.  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  The patient denies fever, weight loss, vision loss, decreased hearing, hoarseness, chest pain, syncope, prolonged cough, abdominal pain, incontinence, muscle weakness, depression, and enlarged lymph nodes.    Vital Signs:  Patient profile:   77 year old male Height:      66 inches Weight:      265 pounds BMI:     42.93 Pulse rate:  76 / minute BP sitting:   120 / 76  (left arm) Cuff size:   large  Vitals Entered By: Bishop Dublin, CMA (February 02, 2010 9:25 AM)  Physical Exam  General:  Well developed, well nourished, in no acute distress. Obese. Head:  normocephalic and atraumatic Neck:  Neck supple, no JVD. No masses, thyromegaly or abnormal cervical nodes. Lungs:  Clear bilaterally to auscultation and percussion. Heart:  Non-displaced PMI, chest  non-tender; regular rate and rhythm, S1, S2 without murmurs, rubs or gallops. Carotid upstroke normal, no bruit.  Pedals normal pulses. 2+ LE  edema, no varicosities. Abdomen:  Bowel sounds positive; abdomen soft and non-tender without masses Msk:  Back normal, normal gait. Muscle strength and tone normal. Pulses:  pulses normal in all 4 extremities Extremities:  No clubbing or cyanosis. Neurologic:  Alert and oriented x 3. Skin:  Intact without lesions or rashes. Psych:  Normal affect.   Impression & Recommendations:  Problem # 1:  SHORTNESS OF BREATH (ICD-786.05) etiology of his shortness of breath may be multifactorial. He does have significant edema though it is difficult to determine if this is dependent edema/venous insufficiency or if he has pulmonary hypertension and fluid overload. His lungs are clear and he has no significant smoking history.  We have ordered an echocardiogram to determine his right ventricular pressures, ejection fraction, whether he has diastolic dysfunction.  Based on the report, we can proceed from there. CT Scan mentioned aortic calcification but he is on statin and aspirin. No significant family history of coronary artery disease. We mentioned to him that if his symptoms persist, his breathing gets worse, we could do a lexiscan though the images might be challenging given his body habitus.  We will call him with the results of his echocardiogram. This will help US guide whether to continue aggressive diuretic regimen. Blood pressure is well controlled today and we will hold off on any medication changes.  His updated medication list for this problem includes:    Verapamil Hcl 80 Mg Tabs (Verapamil hcl) .Marland Kitchen... Three times a day  Orders: Echocardiogram (Echo)  Problem # 2:  EDEMA (ICD-782.3) it is uncertain if this is due to him and her hypertension or venous insufficiency. Echo has been ordered to rule out cardiac pathology.  Orders: Echocardiogram  (Echo)  Patient Instructions: 1)  Your physician recommends that you schedule a follow-up appointment in: 1 month 2)  Your physician has requested that you have an echocardiogram.  Echocardiography is a painless test that uses sound waves to create images of your heart. It provides your doctor with information about the size and shape of your heart and how well your heart's chambers and valves are working.  This procedure takes approximately one hour. There are no restrictions for this procedure.

## 2010-09-12 NOTE — Letter (Signed)
Summary: Historic Patient File  Historic Patient File   Imported By: [place] Carbo 02/03/2010 10:15:04  _____________________________________________________________________  External Attachment:    Type:   Image     Comment:   External Document

## 2010-09-12 NOTE — Letter (Signed)
Summary: Cardiac Catheterization Instructions- Main Lab  [facility] at [address]   [place], [place] [address]   Phone: [phone]  Fax: [phone]     02/10/2010 MRN: [id number]  [facility] [address] [place], [place]  [address]  Dear [name],   You are scheduled for Cardiac Catheterization on February 15, 2010 with Dr. Mariah Milling.  Please arrive at the registration at [facility] at 7:00 a.m.on the day of your procedure.  1. DIET     _x__ Nothing to eat or drink after midnight except your medications with a sip of water.  2. MAKE SURE YOU TAKE YOUR ASPIRIN.  3. __x___ DO NOT TAKE these medications before your procedure:           Furosemide      __x__ YOU MAY TAKE ALL of your remaining medications with a small amount of water.          4. Plan for one night stay - bring personal belongings (i.e. toothpaste, toothbrush, etc.)  5. Bring a current list of your medications and current insurance cards.  6. Must have a responsible person to drive you home.   7. Someone must be with you for the first 24 hours after you arrive home.  8. Please wear clothes that are easy to get on and off and wear slip-on shoes.  *Special note: Every effort is made to have your procedure done on time.  Occasionally there are emergencies that present themselves at the hospital that may cause delays.  Please be patient if a delay does occur.  If you have any questions after you get home, please call the office at the number listed above.  Benedict Needy, RN

## 2010-09-12 NOTE — Assessment & Plan Note (Signed)
Summary: F/U 1 MONTH   Visit Type:  Follow-up Primary Provider:  [facility] TATE,M.D.  CC:  Still sore but doing okay.Marland Kitchen  History of Present Illness: 77 year old male patient of Dr. Arlana Pouch, with a history of obesity, hip and knee discomfort, shortness of breath starting in May which improved with diuretic and long-acting nitroglycerin, history of hypertension and hyperlipidemia, CT scan from 2 years ago suggesting coronary and aortic calcification who presented for shortness of breath and edema on his last visit, s/p cardiac cath at [facility] showing critical left main disease, now s/p urgent CABG. 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  he reports that he is recovering well. He denies any significant lower extremity edema and his Lasix was stopped yesterday. He has had mild cough. He has been working with physical therapy at rehabilitation and feels that he is getting stronger. He denies any lightheadedness or dizziness. He does have some chest pain from his surgical site. He also reports that his right leg discomfort at the site of the vein harvesting is improving.  he reports that his blood pressure in the rehabilitation has typically been 110 systolic over 50s to 60s.  echocardiogram prior to CABG shoed ejection fraction of 35%, anterior wall hypokinesis concerning for underlying ischemia or coronary artery disease.  EKG shows normal sinus rhythm with rate 83 beats per minute, T-wave inversions in leads one, aVL, left axis deviation.  Current Medications (verified): 1)  Lovastatin 40 Mg Tabs (Lovastatin) .... Take One Tablet By Mouth Daily At Bedtime 2)  Omeprazole 20 Mg Cpdr (Omeprazole) .... Once Daily 3)  Aspirin 325 Mg Tabs (Aspirin) .Marland Kitchen.. 1 Once Daily 4)  Folic Acid 1 Mg Tabs (Folic Acid) .Marland Kitchen.. 1 Once Daily 5)  Lisinopril 20 Mg Tabs (Lisinopril) .... Take One Tablet By Mouth Daily 6)  Tramadol Hcl 50 Mg Tabs (Tramadol Hcl) .Marland Kitchen.. 1-2  Tablets Every 6 Hours As Needed For Pain 7)  Tylenol 325 Mg Tabs (Acetaminophen) .... Two Tablets One Tablet Every 4 Hours As Needed 8)  Coreg 6.25 Mg Tabs (Carvedilol) .Marland Kitchen.. 1 Tablet Two Times A Day  Allergies (verified): 1)  ! Pcn  Past History:  Past Medical History: Last updated: 02/02/2010 Hyperlipidemia Hypertension mild CHF  Family History: Last updated: 02/02/2010 Negative FH of Diabetes, Hypertension, or Coronary Artery Disease  Social History: Last updated: 02/02/2010 Tobacco Use - No.  Alcohol Use - no Regular Exercise - no Drug Use - no Retired .[employer] Married   Risk Factors: Exercise: no (02/02/2010)  Risk Factors: Smoking Status: never (02/02/2010)  Past Surgical History: Back surg. x 2 Righ shoulder surg. Left knee surg. Right knee surg CABG x 3 February 14, 2010  Review of Systems  The patient denies fever, weight loss, weight gain, vision loss, decreased hearing, hoarseness, chest pain, syncope, dyspnea on exertion, peripheral edema, prolonged cough, abdominal pain, incontinence, muscle weakness, depression, and enlarged lymph nodes.         Mildly weak, slowly recovering, mild cough  Vital Signs:  Patient profile:   77 year old male Height:      66 inches Weight:      235 pounds Pulse rate:   85 / minute BP sitting:   142 / 79  (left arm) Cuff size:   large  Vitals Entered By: Bishop Dublin, CMA (March 09, 2010 3:04 PM)  Physical Exam  General:  Well developed, well nourished, in no acute  distress. Head:  normocephalic and atraumatic Neck:  Neck supple, no JVD. No masses, thyromegaly or abnormal cervical nodes. Chest Wall:  well-healed sternotomy site, drain sites also well-healing. Lungs:  Clear bilaterally to auscultation and percussion. Heart:  Non-displaced PMI, chest non-tender; regular rate and rhythm, S1, S2 without murmurs, rubs or gallops. Carotid upstroke normal, no bruit.  Pedals normal pulses. No edema, no  varicosities. Abdomen:  Bowel sounds positive; abdomen soft and non-tender without masses,  Msk:  Back normal, normal gait. Muscle strength and tone normal. Pulses:  pulses normal in all 4 extremities Extremities:  No clubbing or cyanosis. Neurologic:  Alert and oriented x 3. Skin:  Intact without lesions or rashes. Psych:  Normal affect.   Impression & Recommendations:  Problem # 1:  CORONARY ARTERY BYPASS GRAFT, THREE VESSEL, HX OF (ICD-V45.81) he is doing very well post bypass on February 14, 2010. His edema has resolved, he feels that his strength is improving. He is due to move to independent living in several weeks' time. We initially thought that we could advance his Coreg to 12.5 mg b.i.d. he reported having borderline low blood pressure. We will continue him on his current medication regimen without changes.  Problem # 2:  HYPERLIPIDEMIA-MIXED (ICD-272.4) Blood product his most recent lipid panel. His LDL goal is less than 70, total cholesterol less than 150.  His updated medication list for this problem includes:    Lovastatin 40 Mg Tabs (Lovastatin) .Marland Kitchen... Take one tablet by mouth daily at bedtime  Other Orders: EKG w/ Interpretation (93000)

## 2010-09-14 NOTE — Assessment & Plan Note (Signed)
Summary: F/U 3 MONTHS   Visit Type:  Follow-up Primary Provider:  Dorian Furnace, M.D.  CC:  Denies chest pain or shortness of breath..  History of Present Illness: 77 year old male patient of Dr. Arlana Pouch, with a history of obesity, hip and knee discomfort, shortness of breath starting in May which improved with diuretic and long-acting nitroglycerin, history of hypertension and hyperlipidemia, CT scan from 2 years ago suggesting coronary and aortic calcification who presented for shortness of breath and edema on his last visit, s/p cardiac cath at [facility] showing critical left main disease, s/p urgent CABG 02/2011. 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 He presents for routine followup.  He reports that he is doing well. He denies any significant chest pain, shortness of breath. He does exercise 5 times a week and walks 1/4 to 1/3 of a mile at a time. No significant lower extremity edema in the morning though he does have mild edema by late afternoon. Blood pressure has been well controlled on clonidine.  echocardiogram prior to CABG shoed ejection fraction of 35%, anterior wall hypokinesis concerning for underlying ischemia or coronary artery disease.  Previous EKG shows normal sinus rhythm with rate 83 beats per minute, T-wave inversions in leads one, aVL, left axis deviation.  Current Medications (verified): 1)  Omeprazole 20 Mg Cpdr (Omeprazole) .... Once Daily 2)  Aspir-Low 81 Mg Tbec (Aspirin) .... One Tablet Once Daily 3)  Lisinopril 20 Mg Tabs (Lisinopril) .... Take One Tablet By Mouth Daily 4)  Coreg 6.25 Mg Tabs (Carvedilol) .... Take 1 Tablet By Mouth Two Times A Day 5)  Pravachol 40 Mg Tabs (Pravastatin Sodium) .... Take 1 Tablet By Mouth Once A Day 6)  Furosemide 40 Mg Tabs (Furosemide) .... Take 1 Tablet By Mouth Once A Day 7)  Naprosyn 500 Mg Tabs (Naproxen) .... One Tablet Once Daily 8)  Clonidine Hcl 0.1 Mg Tabs  (Clonidine Hcl) .... Take One Tablet By Mouth Twice A Day  Allergies (verified): 1)  ! Pcn  Past History:  Past Medical History: Last updated: 02/02/2010 Hyperlipidemia Hypertension mild CHF  Past Surgical History: Last updated: 03/09/2010 Back surg. x 2 Righ shoulder surg. Left knee surg. Right knee surg CABG x 3 February 14, 2010  Family History: Last updated: 02/02/2010 Negative FH of Diabetes, Hypertension, or Coronary Artery Disease  Social History: Last updated: 02/02/2010 Tobacco Use - No.  Alcohol Use - no Regular Exercise - no Drug Use - no Retired .[employer] Married   Risk Factors: Exercise: no (02/02/2010)  Risk Factors: Smoking Status: never (02/02/2010)  Review of Systems       The patient complains of peripheral edema.  The patient denies fever, weight loss, weight gain, vision loss, decreased hearing, hoarseness, chest pain, syncope, dyspnea on exertion, prolonged cough, abdominal pain, incontinence, muscle weakness, depression, and enlarged lymph nodes.    Vital Signs:  Patient profile:   77 year old male Height:      66 inches Weight:      244 pounds BMI:     39.52 Pulse rate:   66 / minute BP sitting:   146 / 86  (left arm) Cuff size:   large  Vitals Entered By: Bishop Dublin, CMA (August 28, 2010 9:57 AM)  Physical Exam  General:  Well developed, well nourished, in no acute distress. Obese Head:  normocephalic and atraumatic Neck:  Neck supple, no JVD. No masses, thyromegaly  or abnormal cervical nodes. Lungs:  Clear bilaterally to auscultation and percussion. Heart:  Non-displaced PMI, chest non-tender; regular rate and rhythm, S1, S2 without murmurs, rubs or gallops. Carotid upstroke normal, no bruit.  Pedals normal pulses. Trace edema b/l LE, no varicosities. Abdomen:  Bowel sounds positive; abdomen soft and non-tender without masses. Obese Msk:  Back normal, normal gait. Muscle strength and tone normal. Pulses:  pulses normal  in all 4 extremities Extremities:  No clubbing or cyanosis. Neurologic:  Alert and oriented x 3. Skin:  Intact without lesions or rashes. Psych:  Normal affect.   Impression & Recommendations:  Problem # 1:  HYPERTENSION, BENIGN (ICD-401.1) bpressure is reasonably well controlled on his current medication regimen. No changes were made.  His updated medication list for this problem includes:    Aspir-low 81 Mg Tbec (Aspirin) ..... One tablet once daily    Lisinopril 20 Mg Tabs (Lisinopril) .Marland Kitchen... Take one tablet by mouth daily    Coreg 6.25 Mg Tabs (Carvedilol) .Marland Kitchen... Take 1 tablet by mouth two times a day    Furosemide 40 Mg Tabs (Furosemide) .Marland Kitchen... Take 1 tablet by mouth once a day    Clonidine Hcl 0.1 Mg Tabs (Clonidine hcl) .Marland Kitchen... Take one tablet by mouth twice a day  His updated medication list for this problem includes:    Aspir-low 81 Mg Tbec (Aspirin) ..... One tablet once daily    Lisinopril 20 Mg Tabs (Lisinopril) .Marland Kitchen... Take one tablet by mouth daily    Coreg 6.25 Mg Tabs (Carvedilol) .Marland Kitchen... Take 1 tablet by mouth two times a day    Furosemide 40 Mg Tabs (Furosemide) .Marland Kitchen... Take 1 tablet by mouth once a day    Clonidine Hcl 0.1 Mg Tabs (Clonidine hcl) .Marland Kitchen... Take one tablet by mouth twice a day  Problem # 2:  CORONARY ARTERY BYPASS GRAFT, THREE VESSEL, HX OF (ICD-V45.81) No symptoms of angina. No further testing. Continue aggressive medical management.  Problem # 3:  HYPERLIPIDEMIA-MIXED (ICD-272.4) he will come in this week for a lipid and LFT check. Goal LDL is less than 70.  His updated medication list for this problem includes:    Pravachol 40 Mg Tabs (Pravastatin sodium) .Marland Kitchen... Take 1 tablet by mouth once a day  Problem # 4:  SHORTNESS OF BREATH (ICD-786.05) No significant shortness of breath on today's visit. Continue Lasix.  His updated medication list for this problem includes:    Aspir-low 81 Mg Tbec (Aspirin) ..... One tablet once daily    Lisinopril 20 Mg Tabs  (Lisinopril) .Marland Kitchen... Take one tablet by mouth daily    Coreg 6.25 Mg Tabs (Carvedilol) .Marland Kitchen... Take 1 tablet by mouth two times a day    Furosemide 40 Mg Tabs (Furosemide) .Marland Kitchen... Take 1 tablet by mouth once a day  Patient Instructions: 1)  Your physician recommends that you schedule a follow-up appointment in: 6 months 2)  Your physician recommends that you return for a FASTING lipid profile: this week (Lipid, Lft, BMP)

## 2010-09-14 NOTE — Progress Notes (Signed)
Summary: Called pt  Phone Note Outgoing Call Call back at [facility] Phone [phone]   Call placed by: Harlon Flor,  August 29, 2010 3:46 PM Call placed to: Patient Summary of Call: LMOM TCB to schedule labwork for this week. Initial call taken by: Harlon Flor,  August 29, 2010 3:46 PM

## 2010-10-28 LAB — CBC
HCT: 26.7 % — ABNORMAL LOW (ref 39.0–52.0)
Hemoglobin: 8.8 g/dL — ABNORMAL LOW (ref 13.0–17.0)
MCH: 30.3 pg (ref 26.0–34.0)
MCHC: 33 g/dL (ref 30.0–36.0)
MCV: 91.8 fL (ref 78.0–100.0)
Platelets: 396 K/uL (ref 150–400)
RBC: 2.91 MIL/uL — ABNORMAL LOW (ref 4.22–5.81)
RDW: 15 % (ref 11.5–15.5)
WBC: 11.9 K/uL — ABNORMAL HIGH (ref 4.0–10.5)

## 2010-10-28 LAB — POCT I-STAT, CHEM 8
BUN: 22 mg/dL (ref 6–23)
Calcium, Ion: 1.04 mmol/L — ABNORMAL LOW (ref 1.12–1.32)
Chloride: 104 meq/L (ref 96–112)
Creatinine, Ser: 1.2 mg/dL (ref 0.4–1.5)
Glucose, Bld: 124 mg/dL — ABNORMAL HIGH (ref 70–99)
HCT: 27 % — ABNORMAL LOW (ref 39.0–52.0)
Hemoglobin: 9.2 g/dL — ABNORMAL LOW (ref 13.0–17.0)
Potassium: 4.5 meq/L (ref 3.5–5.1)
Sodium: 137 meq/L (ref 135–145)
TCO2: 26 mmol/L (ref 0–100)

## 2010-10-28 LAB — DIFFERENTIAL
Eosinophils Absolute: 0.6 10*3/uL (ref 0.0–0.7)
Lymphs Abs: 0.6 10*3/uL — ABNORMAL LOW (ref 0.7–4.0)
Monocytes Relative: 7 % (ref 3–12)
Neutrophils Relative %: 82 % — ABNORMAL HIGH (ref 43–77)

## 2010-10-29 LAB — CBC
HCT: 22.9 % — ABNORMAL LOW (ref 39.0–52.0)
HCT: 23.4 % — ABNORMAL LOW (ref 39.0–52.0)
HCT: 24 % — ABNORMAL LOW (ref 39.0–52.0)
HCT: 24.9 % — ABNORMAL LOW (ref 39.0–52.0)
HCT: 27.5 % — ABNORMAL LOW (ref 39.0–52.0)
HCT: 39.6 % (ref 39.0–52.0)
Hemoglobin: 7.8 g/dL — ABNORMAL LOW (ref 13.0–17.0)
Hemoglobin: 7.8 g/dL — ABNORMAL LOW (ref 13.0–17.0)
Hemoglobin: 8.1 g/dL — ABNORMAL LOW (ref 13.0–17.0)
Hemoglobin: 8.3 g/dL — ABNORMAL LOW (ref 13.0–17.0)
MCH: 30.6 pg (ref 26.0–34.0)
MCH: 30.9 pg (ref 26.0–34.0)
MCH: 30.9 pg (ref 26.0–34.0)
MCH: 31 pg (ref 26.0–34.0)
MCH: 31.4 pg (ref 26.0–34.0)
MCHC: 33.5 g/dL (ref 30.0–36.0)
MCHC: 33.5 g/dL (ref 30.0–36.0)
MCHC: 33.5 g/dL (ref 30.0–36.0)
MCHC: 33.9 g/dL (ref 30.0–36.0)
MCHC: 34 g/dL (ref 30.0–36.0)
MCV: 91.4 fL (ref 78.0–100.0)
MCV: 91.6 fL (ref 78.0–100.0)
MCV: 92.3 fL (ref 78.0–100.0)
MCV: 92.3 fL (ref 78.0–100.0)
MCV: 92.5 fL (ref 78.0–100.0)
MCV: 92.6 fL (ref 78.0–100.0)
Platelets: 107 10*3/uL — ABNORMAL LOW (ref 150–400)
Platelets: 121 10*3/uL — ABNORMAL LOW (ref 150–400)
Platelets: 123 10*3/uL — ABNORMAL LOW (ref 150–400)
Platelets: 134 10*3/uL — ABNORMAL LOW (ref 150–400)
Platelets: 146 10*3/uL — ABNORMAL LOW (ref 150–400)
RBC: 2.47 MIL/uL — ABNORMAL LOW (ref 4.22–5.81)
RBC: 2.52 MIL/uL — ABNORMAL LOW (ref 4.22–5.81)
RBC: 2.63 MIL/uL — ABNORMAL LOW (ref 4.22–5.81)
RBC: 2.69 MIL/uL — ABNORMAL LOW (ref 4.22–5.81)
RBC: 2.85 MIL/uL — ABNORMAL LOW (ref 4.22–5.81)
RBC: 4.32 MIL/uL (ref 4.22–5.81)
RDW: 14 % (ref 11.5–15.5)
RDW: 14.3 % (ref 11.5–15.5)
RDW: 14.3 % (ref 11.5–15.5)
RDW: 14.7 % (ref 11.5–15.5)
RDW: 14.9 % (ref 11.5–15.5)
RDW: 15 % (ref 11.5–15.5)
RDW: 15.2 % (ref 11.5–15.5)
WBC: 12 10*3/uL — ABNORMAL HIGH (ref 4.0–10.5)
WBC: 12.3 10*3/uL — ABNORMAL HIGH (ref 4.0–10.5)
WBC: 8.1 10*3/uL (ref 4.0–10.5)
WBC: 9.1 10*3/uL (ref 4.0–10.5)
WBC: 9.3 10*3/uL (ref 4.0–10.5)

## 2010-10-29 LAB — POCT I-STAT 4, (NA,K, GLUC, HGB,HCT)
Glucose, Bld: 112 mg/dL — ABNORMAL HIGH (ref 70–99)
Glucose, Bld: 115 mg/dL — ABNORMAL HIGH (ref 70–99)
Glucose, Bld: 137 mg/dL — ABNORMAL HIGH (ref 70–99)
Glucose, Bld: 137 mg/dL — ABNORMAL HIGH (ref 70–99)
HCT: 28 % — ABNORMAL LOW (ref 39.0–52.0)
HCT: 28 % — ABNORMAL LOW (ref 39.0–52.0)
HCT: 34 % — ABNORMAL LOW (ref 39.0–52.0)
Hemoglobin: 12.9 g/dL — ABNORMAL LOW (ref 13.0–17.0)
Hemoglobin: 9.2 g/dL — ABNORMAL LOW (ref 13.0–17.0)
Hemoglobin: 9.5 g/dL — ABNORMAL LOW (ref 13.0–17.0)
Hemoglobin: 9.5 g/dL — ABNORMAL LOW (ref 13.0–17.0)
Potassium: 3.5 mEq/L (ref 3.5–5.1)
Potassium: 3.9 mEq/L (ref 3.5–5.1)
Potassium: 4 mEq/L (ref 3.5–5.1)
Sodium: 133 mEq/L — ABNORMAL LOW (ref 135–145)
Sodium: 135 mEq/L (ref 135–145)
Sodium: 137 mEq/L (ref 135–145)
Sodium: 140 mEq/L (ref 135–145)

## 2010-10-29 LAB — BLOOD GAS, ARTERIAL
Acid-Base Excess: 1.7 mmol/L (ref 0.0–2.0)
Drawn by: 31996
Patient temperature: 98.6
pCO2 arterial: 41.5 mmHg (ref 35.0–45.0)
pH, Arterial: 7.412 (ref 7.350–7.450)

## 2010-10-29 LAB — BASIC METABOLIC PANEL
BUN: 11 mg/dL (ref 6–23)
BUN: 16 mg/dL (ref 6–23)
BUN: 19 mg/dL (ref 6–23)
BUN: 22 mg/dL (ref 6–23)
CO2: 24 mEq/L (ref 19–32)
CO2: 25 mEq/L (ref 19–32)
CO2: 27 mEq/L (ref 19–32)
CO2: 32 mEq/L (ref 19–32)
Calcium: 7.3 mg/dL — ABNORMAL LOW (ref 8.4–10.5)
Calcium: 7.8 mg/dL — ABNORMAL LOW (ref 8.4–10.5)
Calcium: 7.8 mg/dL — ABNORMAL LOW (ref 8.4–10.5)
Calcium: 8.5 mg/dL (ref 8.4–10.5)
Chloride: 100 mEq/L (ref 96–112)
Chloride: 105 mEq/L (ref 96–112)
Chloride: 109 mEq/L (ref 96–112)
Chloride: 98 mEq/L (ref 96–112)
Creatinine, Ser: 1.06 mg/dL (ref 0.4–1.5)
Creatinine, Ser: 1.14 mg/dL (ref 0.4–1.5)
Creatinine, Ser: 1.24 mg/dL (ref 0.4–1.5)
Creatinine, Ser: 1.3 mg/dL (ref 0.4–1.5)
GFR calc Af Amer: >60 mL/min (ref >60)
GFR calc Af Amer: >60 mL/min (ref >60)
GFR calc Af Amer: >60 mL/min (ref >60)
GFR calc Af Amer: >60 mL/min (ref >60)
GFR calc non Af Amer: 53 mL/min — ABNORMAL LOW (ref >60)
GFR calc non Af Amer: 56 mL/min — ABNORMAL LOW (ref >60)
GFR calc non Af Amer: >60 mL/min (ref >60)
GFR calc non Af Amer: >60 mL/min (ref >60)
Glucose, Bld: 108 mg/dL — ABNORMAL HIGH (ref 70–99)
Glucose, Bld: 110 mg/dL — ABNORMAL HIGH (ref 70–99)
Glucose, Bld: 127 mg/dL — ABNORMAL HIGH (ref 70–99)
Glucose, Bld: 139 mg/dL — ABNORMAL HIGH (ref 70–99)
Potassium: 3.6 mEq/L (ref 3.5–5.1)
Potassium: 3.6 mEq/L (ref 3.5–5.1)
Potassium: 4 mEq/L (ref 3.5–5.1)
Potassium: 4.3 mEq/L (ref 3.5–5.1)
Sodium: 132 mEq/L — ABNORMAL LOW (ref 135–145)
Sodium: 136 mEq/L (ref 135–145)
Sodium: 139 mEq/L (ref 135–145)
Sodium: 140 mEq/L (ref 135–145)

## 2010-10-29 LAB — PREPARE PLATELETS

## 2010-10-29 LAB — POCT I-STAT 3, ART BLOOD GAS (G3+)
Acid-Base Excess: 1 mmol/L (ref 0.0–2.0)
Acid-Base Excess: 1 mmol/L (ref 0.0–2.0)
Bicarbonate: 25.8 mEq/L — ABNORMAL HIGH (ref 20.0–24.0)
Bicarbonate: 25.9 mEq/L — ABNORMAL HIGH (ref 20.0–24.0)
O2 Saturation: 100 %
Patient temperature: 35.2
Patient temperature: 36.6
Patient temperature: 36.7
TCO2: 27 mmol/L (ref 0–100)
TCO2: 27 mmol/L (ref 0–100)
pCO2 arterial: 40.9 mmHg (ref 35.0–45.0)
pH, Arterial: 7.367 (ref 7.350–7.450)
pH, Arterial: 7.378 (ref 7.350–7.450)
pH, Arterial: 7.395 (ref 7.350–7.450)
pO2, Arterial: 343 mmHg — ABNORMAL HIGH (ref 80.0–100.0)

## 2010-10-29 LAB — GLUCOSE, CAPILLARY
Glucose-Capillary: 112 mg/dL — ABNORMAL HIGH (ref 70–99)
Glucose-Capillary: 117 mg/dL — ABNORMAL HIGH (ref 70–99)
Glucose-Capillary: 123 mg/dL — ABNORMAL HIGH (ref 70–99)
Glucose-Capillary: 134 mg/dL — ABNORMAL HIGH (ref 70–99)
Glucose-Capillary: 136 mg/dL — ABNORMAL HIGH (ref 70–99)
Glucose-Capillary: 156 mg/dL — ABNORMAL HIGH (ref 70–99)

## 2010-10-29 LAB — CREATININE, SERUM
Creatinine, Ser: 1.35 mg/dL (ref 0.4–1.5)
GFR calc Af Amer: >60 mL/min (ref >60)
GFR calc non Af Amer: 51 mL/min — ABNORMAL LOW (ref >60)

## 2010-10-29 LAB — HEMOGLOBIN A1C
Hgb A1c MFr Bld: 6 % (ref <5.7)
Mean Plasma Glucose: 126 mg/dL — ABNORMAL HIGH (ref <117)

## 2010-10-29 LAB — COMPREHENSIVE METABOLIC PANEL
Alkaline Phosphatase: 54 U/L (ref 39–117)
BUN: 12 mg/dL (ref 6–23)
CO2: 27 mEq/L (ref 19–32)
Chloride: 104 mEq/L (ref 96–112)
GFR calc non Af Amer: >60 mL/min (ref >60)
Glucose, Bld: 104 mg/dL — ABNORMAL HIGH (ref 70–99)
Potassium: 4 mEq/L (ref 3.5–5.1)
Total Bilirubin: 0.6 mg/dL (ref 0.3–1.2)

## 2010-10-29 LAB — CROSSMATCH
ABO/RH(D): A POS
Antibody Screen: NEGATIVE

## 2010-10-29 LAB — PREPARE FRESH FROZEN PLASMA

## 2010-10-29 LAB — POCT I-STAT, CHEM 8
Glucose, Bld: 154 mg/dL — ABNORMAL HIGH (ref 70–99)
HCT: 24 % — ABNORMAL LOW (ref 39.0–52.0)
Hemoglobin: 8.2 g/dL — ABNORMAL LOW (ref 13.0–17.0)
Potassium: 4.2 mEq/L (ref 3.5–5.1)

## 2010-10-29 LAB — PROTIME-INR
INR: 1.56 — ABNORMAL HIGH (ref 0.00–1.49)
Prothrombin Time: 18.5 seconds — ABNORMAL HIGH (ref 11.6–15.2)

## 2010-10-29 LAB — MRSA PCR SCREENING: MRSA by PCR: POSITIVE — AB

## 2010-10-29 LAB — LIPID PANEL
Cholesterol: 101 mg/dL (ref 0–200)
HDL: 30 mg/dL — ABNORMAL LOW (ref >39)
LDL Cholesterol: 56 mg/dL (ref 0–99)
Total CHOL/HDL Ratio: 3.4 RATIO
Triglycerides: 75 mg/dL (ref <150)
VLDL: 15 mg/dL (ref 0–40)

## 2010-10-29 LAB — APTT: aPTT: 33 seconds (ref 24–37)

## 2010-10-29 LAB — MAGNESIUM
Magnesium: 2.6 mg/dL — ABNORMAL HIGH (ref 1.5–2.5)
Magnesium: 2.9 mg/dL — ABNORMAL HIGH (ref 1.5–2.5)

## 2010-10-29 LAB — HEMOGLOBIN AND HEMATOCRIT, BLOOD: Hemoglobin: 9.4 g/dL — ABNORMAL LOW (ref 13.0–17.0)

## 2010-12-26 NOTE — Assessment & Plan Note (Signed)
OFFICE VISIT   EMMERICH, CRYER  DOB:  28-Apr-1926                                        Jan 01, 2008  CHART #:  [id number]   Blood pressure is 176/91.  Pulse 82.  Respirations 18.  Sats were 96%.  His tube track is closing down.  He looks good.  Will continue the same  dressing changes, and I will plan to see him back again in 2 weeks with  a chest x-ray.   Ines Bloomer, M.D.  Electronically Signed   DPB/MEDQ  D:  01/01/2008  T:  01/01/2008  Job:  [id number]

## 2010-12-26 NOTE — Assessment & Plan Note (Signed)
OFFICE VISIT   ADREN, DOLLINS  DOB:  Dec 20, 1925                                        Dec 25, 2007  CHART #:  [id number]   Mr. [name] came today and his chest x-ray still shows a small air fluid  level posteriorly.  His blood pressure is 126/78, pulse 78, respirations  18, saturating 97%.  We probed his empyema tube track, opened it up and  had some drainage out of that.  I think this will gradually heal in, but  I will see him back again in one week and check the track again.   Ines Bloomer, M.D.  Electronically Signed   DPB/MEDQ  D:  12/25/2007  T:  12/25/2007  Job:  [id number]

## 2010-12-26 NOTE — Assessment & Plan Note (Signed)
OFFICE VISIT   NIHAR, KLUS  DOB:  05-17-1926                                        March 29, 2010  CHART #:  [id number]   CURRENT PROBLEMS:  1. Status post emergency CABG x3 February 15, 2010, for 95% left main, 30%      ejection fraction.  2. Postoperative cellulitis and edema of right lower extremity      following saphenous vein harvest, resolved.  3. Hypertension.  4. Obesity.   PRESENT ILLNESS:  The patient returns now for a 6-week followup after  emergency multivessel bypass grafting when he presented with a tight  left main and unstable angina.  He is an 77 year old nondiabetic  nonsmoker who has had a slow but progressive recovery.  He is ready to  make the transition from skilled nursing facility to independent  apartment living at [facility] in [place].  He has  maintained sinus rhythm and he is not on a Coumadin.  His weight is down  20 pounds, and he has no angina and incisions are healed nicely.   CURRENT MEDICATIONS:  Coreg 6.25 mg b.i.d., lisinopril 20 mg daily,  pravastatin 40 mg a day, Prilosec 20 mg a day, and aspirin 325 mg a day.   PHYSICAL EXAMINATION:  Vital Signs:  Blood pressure 100/63, pulse 96 and  regular, respirations 18, saturation 92-93% on room air.  General:  He  is alert and pleasant and oriented.  Lungs:  Breath sounds are clear.  The sternum is well healed.  Cardiac:  Rhythm is regular without murmur,  rub, or gallop.  The leg incision is healed.  There is no evidence of  peripheral edema.   PA and lateral chest x-ray shows clear lung fields without pleural  effusion.  The sternal wires are intact and aligned.  The cardiac  silhouette has decreased in size.   IMPRESSION AND PLAN:  Overall, he has shown good initial recovery now 6  weeks after surgery and ready to return now living in a home  environment.  He knows he can resume driving, light activities up to 10  pounds lifting, but no more than that  until October.  I will plan on  seeing him back in 1 month to assess his progress.  He was given home  prescriptions for his medications listed above.  He did not request any  pain medicine.   Kerin Perna, M.D.  Electronically Signed   PV/MEDQ  D:  03/29/2010  T:  03/29/2010  Job:  [id number]   cc:   Antonieta Iba, MD

## 2010-12-26 NOTE — Assessment & Plan Note (Signed)
OFFICE VISIT   [name]  DOB:  1925/09/08                                        April 27, 2010  CHART #:  [id number]   PRESENT ILLNESS:  The patient is a very nice 77 year old gentleman who  returns for his final office visit after undergoing emergency  multivessel bypass grafting in early July for severe left main and three-  vessel disease.  He is now back living at home and is walking 30 minutes  a day without recurrent angina.  His incisions have healed and his  problems with lower extremity edema have resolved.  He has maintained a  sinus rhythm, and his only issue is some recurrence of his hypertension  with blood pressure today being 170/80.  His weight is down to 183  pounds from well over 300 pounds.   PHYSICAL EXAMINATION:  Heart rate is 62, saturation on room air 96%, and  his blood pressure is 170/80.  Breath sounds are clear and equal, and  the incision is well healed.  He has mild ankle edema.   PLAN:  The patient will return to the care of his medical physician, Dr.  Mariah Milling, and a new primary care physician he is in the process of  establishing.  I told him he should increase his Cipro from 20-40 mg a  day, and I also gave him a prescription for some Lasix 40 mg to take as  needed for swelling and for some Naprosyn which he has been on chronic  for his arthritis.  He was encouraged to have his blood pressure  evaluated by his medical physicians in the near future for adjustment of  medications.  He will return here as needed.   [name], M.D.  Electronically Signed   PV/MEDQ  D:  04/27/2010  T:  04/28/2010  Job:  [id number]   cc:   Antonieta Iba, MD
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
## 2010-12-26 NOTE — Assessment & Plan Note (Signed)
OFFICE VISIT   [name], [name]  DOB:  October 26, 1925                                        March 16, 2008  CHART #:  [id number]   HISTORY OF PRESENT ILLNESS:  [name] is an 77 year old Caucasian male  who underwent left twelfth resection of rib and drainage of left empyema  by [name] on November 22, 2007.  He was last seen in the office on February 05, 2008, at which time he was doing very well overall.  He currently  has no complaints.   PHYSICAL EXAMINATION:  GENERAL:  This is a pleasant 77 year old  Caucasian male, in no acute distress who is alert, oriented, and  cooperative.  CARDIOVASCULAR:  Regular rate and rhythm.  PULMONARY:  Clear to auscultation bilaterally.  Slightly diminished the  left base.  EXTREMITIES:  No cyanosis, clubbing, or edema.  Left posterior wound  slightly opened, but no drainage.  No purulence and no evidence of  cellulitis.  Per [name], this is continuing to heal and slowly  beginning to granulate and close.   Chest x-ray done today showed decreasing left small pleural effusion.  No pneumothorax noted.   IMPRESSION AND PLAN:  [name] continues to do well.  He will be seen  on a p.r.n. basis.  He is to call if there is any drainage, redness,  fever, or chills related to the left posterior wound.  He is to continue  to be followed by his medical doctor, and he is to call for any  questions, problems, or concerns.   Ines Bloomer, M.D.  Electronically Signed   DZ/MEDQ  D:  03/16/2008  T:  03/17/2008  Job:  [id number]
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
## 2010-12-26 NOTE — Assessment & Plan Note (Signed)
OFFICE VISIT   DEVESH, MONFORTE  DOB:  [birth year]                                        Dec 17, 2007  CHART #:  [id number]   Mr. Carley came for followup today.  His chest x-ray just shows small  loculated effusion.  I removed his chest tube, his empyema tube, probed  the wound, and it appears to be opened and was put on daily dressing  changes and see him back again in 1 week with another chest x-ray.  His  blood pressure was 166/95, pulse 96, respirations 18, sats 97%.  He is  gradually gaining his strength.   Ines Bloomer, M.D.  Electronically Signed   DPB/MEDQ  D:  12/17/2007  T:  12/17/2007  Job:  [id number]

## 2010-12-26 NOTE — Assessment & Plan Note (Signed)
OFFICE VISIT   [name], [name]  DOB:  [birth year]                                        January 15, 2008  CHART #:  [id number]   Patient is now approximately two months following his treatment for a  left chest empyema.  Most recently, we have been following him for an  infected tract from the tube.  He has been receiving local wound care  with packing.  He continues to make significant progress in this regard.  Currently, he denies fevers, chills, or other constitutional symptoms.  He reports that his activity level is essentially normal for him.  He  has returned to driving.  He continues to work in his wood shop and take  care of his yard.   CHEST X-RAY:  The chest x-ray was evaluated on today's date.  There are  no significant acute findings.  There are no effusions or infiltrates.   PHYSICAL EXAMINATION:  Vital Signs:  Blood pressure is 144/80, pulse 76,  respirations 18, oxygen saturation 96%.  The chest tube site tract is  less than 1 cm in depth.  It has excellent granulation tissue.  There is  no drainage or purulence.  There is no evidence of cellulitis.  Pulmonary examination reveals clear breath sounds.  Cardiac:  Regular  rate and rhythm.  Normal S1 and S2.   ASSESSMENT:  Patient continues to make excellent progress.  The wound  can now be covered daily with a dry dressing, as needed, so as to  protect his clothing from any type of soiling, otherwise he does not  need to pack the wound.  We will see him again in three weeks for one  last check of his wound healing status.   Rowe Clack, P.A.-C.   [name]  D:  01/15/2008  T:  01/15/2008  Job:  [id number]

## 2010-12-26 NOTE — Assessment & Plan Note (Signed)
OFFICE VISIT   [name], [name]  DOB:  [birth year]                                        February 05, 2008  CHART #:  [id number]   His blood pressure was 120/75, pulse 94, respirations 18, and sats were  93%.  His chest tube tract has finally healed.  He is doing well  overall.  We will see him back again in 6 weeks with a chest x-ray for  the final check of his empyema.  He is feeling great.   Ines Bloomer, M.D.  Electronically Signed   DPB/MEDQ  D:  02/05/2008  T:  02/05/2008  Job:  [id number]

## 2010-12-26 NOTE — Letter (Signed)
December 04, 2007   Freda Munro  [address]  [place], [place] [address]   Re:  LISA, BLAKEMAN                 DOB:  01-13-1926   Dear Dr. Welton Flakes,   I saw Mr. Pileggi back after we drained his empyema.  We think this is  probably an infected hematoma, secondary to some trauma.  His empyema  tube was draining well.  He is presently at a nursing home, but he is  doing much better and getting stronger.  He had two empyema tubes and I  removed one of them and put him on straight drainage to his one empyema  tube.  His chest x-ray is stable.  Overall, I think he is making  satisfactory progress and hopefully will have the final empyema tube out  in two to three more weeks.   His blood pressure was 132/72, pulse 82, respirations 18, sats were 95%.   He is on doxycycline which was recommended by infectious disease.   Ines Bloomer, M.D.  Electronically Signed   DPB/MEDQ  D:  12/04/2007  T:  12/05/2007  Job:  [id number]

## 2010-12-26 NOTE — Assessment & Plan Note (Signed)
OFFICE VISIT   [name], [name]  DOB:  01/22/1926                                        December 09, 2007  CHART #:  [id number]   REASON FOR VISIT:  Accidental removal of left chest tube.   [name] is status post resection of rib and drainage of left chest  plane and left VATS by Dr. Edwyna Shell on November 22, 2007.  The patient had  been seen in the office on December 05, 2007 where Dr. Edwyna Shell removed 1 of  2 of his chest tubes.  He placed the other chest tube to straight  drainage, and originally he was going to be seen in 2-3 weeks; however,  on December 08, 2007, the patient accidentally pulled out the left chest  tube.  The patient denied any shortness of breath or fever or chills.   PHYSICAL EXAMINATION:  VITAL SIGNS:  Blood pressure 142/85, pulse rate  99, respirations 18, O2 saturation 91% on room air.  LUNGS:  He had diminished breath sounds at the left base.   Chest x-ray today showed a moderate left pleural effusion.   IMPRESSION AND PLAN:  Dr. Edwyna Shell placed a left chest tube.  The chest  tube was secured and placed to straight drainage.  Dressings were  applied.  Chest tube secured.  The patient is going to be seen in  followup with a chest x-ray in 1 week.  It is felt at that time that the  chest tube will able to be removed.  The patient is instructed to call  for any further problems, questions, or concerns in the interim.   Doree Fudge, PA   DZ/MEDQ  D:  12/09/2007  T:  12/09/2007  Job:  [id number]

## 2010-12-26 NOTE — Assessment & Plan Note (Signed)
OFFICE VISIT   FITZPATRICK, Glenn  DOB:  09-18-1925                                        March 08, 2010  CHART #:  [id number]   CURRENT PROBLEMS:  1. Status post emergency coronary artery bypass graft x3, February 15, 2010, for 95% left main.  2. Cellulitis and edema of right lower extremity, status post      saphenous vein harvest.  3. Obesity.  4. Hypertension.   PRESENT ILLNESS:  The patient returns for followup of his lower  extremity edema and cellulitis after emergency CABG 3 weeks ago.  He is  living at the rehab center, [facility] in [place].  He is  walking daily and receives physical therapy.  His appetite is better  after stopping the oral iron supplement.  His lower extremity edema and  cellulitis has much improved after increased diuretics (Lasix plus  Zaroxolyn) and oral Avelox and doxycycline.  He has no angina.  He  continues to lose weight gradually and is down to 235 pounds.   PHYSICAL EXAMINATION:  Vital Signs:  Blood pressure 96/60, pulse 70,  respirations 18, and saturation on room air 90%.  Weight 235 pounds.  General:  He is alert, pleasant, and feels much better than last week.  Lungs:  Breath sounds are clear.  Cardiac:  Rhythm is regular without  gallop or rub.  Extremities:  Lower extremity pedal edema has almost  totally resolved.  The cellulitis has resolved.  He has some groin  candida and that has responded to oral nystatin powder.   PLAN:  We will stop the Zaroxolyn and hold his Lasix for the next 40  hours as his blood pressure has been soft.  We will discontinue the oral  antibiotics.  He will continue to rehab and improve on deconditioning at  the [facility].  I will see him back in approximately 3  weeks with a chest x-ray.  He knows not to drive or lift more than 5  pounds to 10 pounds until he returns.   Kerin Perna, M.D.  Electronically Signed   PV/MEDQ  D:  03/08/2010  T:  03/09/2010   Job:  [id number]   cc:   Antonieta Iba, MD

## 2010-12-26 NOTE — Assessment & Plan Note (Signed)
OFFICE VISIT   [name], [name]  DOB:  May 07, 1926                                        March 01, 2010  CHART #:  [id number]   CURRENT PROBLEMS:  1. Status post emergency coronary artery bypass graft x3, February 15, 2010, for 95% left main stenosis with unstable angina.  2. Hypertension.  3. Obesity.  4. Cellulitis of the saphenous vein harvest site in the right lower      extremity.   PRESENT ILLNESS:  The patient returns for his first office visit  approximately 2 weeks postop.  He is living at a [facility].  He was recently seen in the emergency department for cellulitis  of the right leg and was started on oral doxycycline.  He denies fever.  He is walking twice a day and he has lost weight down to 240 pounds from  his preop weight of 250 pounds.  He remains on his discharge medications  which include Coreg, Protonix, Cymbalta, Zocor, Ultram, Niferex, Lasix,  lisinopril, and folic acid.  He denies angina.  He has been constipated,  he has a poor appetite, and he is having some insomnia.  No significant  incisional pain.   PHYSICAL EXAMINATION:  Blood pressure 140/70, pulse 80, respirations 20,  saturation on room air 90%.  He is alert and pleasant.  Breath sounds  are slightly diminished.  His sternal incision is healing well.  It is  stable.  Cardiac rhythm is regular without gallop or murmur.  His right  lower extremity is edematous.  It is erythematous.  There is no  fluctuance of the vein tunnel site.  No drainage of the leg incision.   DIAGNOSTIC TESTS:  PA and lateral chest x-ray reveals improved aeration,  sternal wires intact, no significant pleural effusion.  Cardiomegaly,  baseline.   PLAN:  The patient will be started on oral Zaroxolyn in addition to his  Lasix for improved diuresis.  He will be given Avelox 400 mg a day in  addition to the doxycycline for his leg cellulitis.  He will also be  discontinued on his iron,  and we will give him a prescription for  nystatin powder for a yeast rash in his groins.  He will return to see  me for wound check in 1 week.   Kerin Perna, M.D.  Electronically Signed   PV/MEDQ  D:  03/01/2010  T:  03/02/2010  Job:  [id number]   cc:   Antonieta Iba, MD

## 2010-12-26 NOTE — Op Note (Signed)
NAMEKASYN, ROLPH                 ACCOUNT NO.:  [id number]   MEDICAL RECORD NO.:  [id number]          PATIENT TYPE:  INP   LOCATION:  3301                         FACILITY:  [facility]   PHYSICIAN:  Ines Bloomer, M.D. DATE OF BIRTH:  1925/12/31   DATE OF PROCEDURE:  DATE OF DISCHARGE:                               OPERATIVE REPORT   PREOPERATIVE DIAGNOSIS:  Left chest empyema.   POSTOPERATIVE DIAGNOSIS:  Left chest empyema.   OPERATION PERFORMED:  Resection of rib and drainage of left chest  empyema and left video-assisted thoracoscopic surgery.   SURGEON:  Dr. Patricia Nettle. Burney.   PROCEDURE:  After general anesthesia, the patient turned to the left  lateral thoracotomy position.  A 4-5 cm incision was made at the  posterior axillary line over the seventh rib and dissection was carried  down the left cautery dividing the muscle down to the seventh rib and  subperiosteal dissection was made of approximately 4-5 cm at the seventh  rib.  This was removed and the empyema space was removed and a 1000 mL  of purulent pus was evacuated.  The area was then irrigated copiously  and a zero-degree scope was inserted at the end.  Using the curette, we  curated off all the exudate off the wall, sending some of this for  pathology and then off the lung.  We then inserted a straight and a  right-angle #36 chest tube.  We sutured the muscle with 2-0 Vicryl and  then chest tubes were sutured in place with #1 Prolene and dry sterile  dressing applied.  The patient was turned to the recovery room in stable  condition.      Ines Bloomer, M.D.  Electronically Signed     DPB/MEDQ  D:  11/22/2007  T:  11/22/2007  Job:  [id number]

## 2010-12-26 NOTE — Discharge Summary (Signed)
[name], [name]                 ACCOUNT NO.:  [id number]   MEDICAL RECORD NO.:  [id number]          PATIENT TYPE:  INP   LOCATION:  2021                         FACILITY:  [facility]   PHYSICIAN:  Ines Bloomer, M.D. DATE OF BIRTH:  03/27/1926   DATE OF ADMISSION:  11/21/2007  DATE OF DISCHARGE:  11/28/2007                               DISCHARGE SUMMARY   ADDENDUM:  [name] is progressing well and will be discharged to a  skilled nursing facility on November 28, 2007.  He has been offered to  [facility] [place].  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  Dr. Orvan Falconer has discontinued his IV clindamycin and  Avelox and has switched him to oral doxycycline for outpatient therapy.  He has had some diarrhea following antibiotic administration, and a  Clostridium difficile toxin will be obtained.  Otherwise, he is  progressing well.  There have been no other changes since the previously  dictated discharge summary.  His wounds are all healing well.   LABORATORY DATA:  His updated labs since the previously dictated  discharge summary show a hemoglobin of 9.3, hematocrit 27.8, white count  12.4, platelets 587.  Sodium 137, potassium 3.8, BUN 10, creatinine  0.89.  His chest x-ray has remained stable.  He has been switched to a  mini express chest tube drainage system, and no air leak has been noted.  He will be evaluated with a chest x-ray and physical exam on morning  rounds on November 28, 2007, and hopefully at that time will be able to be  transferred to the skilled nursing facility.   CORRECTED LIST OF DISCHARGE MEDICATIONS:  1. Verapamil 80 mg t.i.d.  2. Prazosin 2 mg t.i.d.  3. Lovastatin 80 mg daily.  4. Cymbalta 60 mg daily.  5. Alprazolam 0.5 mg t.i.d.  6. Nu-Iron 150 mg daily.  7. Doxycycline 100 mg b.i.d.  8. Ultram 50 to 100 mg q.4h. p.r.n. for pain.  9. Tylenol as needed for pain or  fever.   DISCHARGE INSTRUCTIONS:  Unchanged from the previously dictated  discharge summary.   DISCHARGE FOLLOWUP:  He will see Dr. Edwyna Shell in the office on Thursday,  December 04, 2007 at 4:00 p.m. with a chest x-ray from [facility] imaging  at 3:30 p.m.  Any questions or problems in the interim may be addressed  to our office.      Coral Ceo, P.A.      Ines Bloomer, M.D.  Electronically Signed    GC/MEDQ  D:  11/27/2007  T:  11/27/2007  Job:  [id number]

## 2011-02-15 ENCOUNTER — Encounter: Payer: Self-pay | Admitting: Cardiovascular Disease

## 2011-02-15 ENCOUNTER — Ambulatory Visit (INDEPENDENT_AMBULATORY_CARE_PROVIDER_SITE_OTHER): Payer: Medicare Other | Admitting: Cardiovascular Disease

## 2011-02-15 DIAGNOSIS — R609 Edema, unspecified: Secondary | ICD-10-CM

## 2011-02-15 DIAGNOSIS — Z951 Presence of aortocoronary bypass graft: Secondary | ICD-10-CM

## 2011-02-15 DIAGNOSIS — E785 Hyperlipidemia, unspecified: Secondary | ICD-10-CM

## 2011-02-15 DIAGNOSIS — I10 Essential (primary) hypertension: Secondary | ICD-10-CM

## 2011-02-15 DIAGNOSIS — R0602 Shortness of breath: Secondary | ICD-10-CM

## 2011-02-15 MED ORDER — CLONIDINE HCL 0.2 MG PO TABS: 0.2000 mg | ORAL_TABLET | Freq: Two times a day (BID) | ORAL | Status: DC

## 2011-02-15 NOTE — Assessment & Plan Note (Addendum)
Edema has improved. No further workup at this time. Continue Lasix daily.

## 2011-02-15 NOTE — Assessment & Plan Note (Signed)
Currently with no symptoms of angina. No further workup at this time. Continue current medication regimen. 

## 2011-02-15 NOTE — Assessment & Plan Note (Signed)
Blood pressure is elevated. We will increase the clonidine to 0.2 mg b.i.d. We have suggested to monitor his blood pressure at home and contact us if it continues to be elevated.

## 2011-02-15 NOTE — Assessment & Plan Note (Signed)
Continue statin. Goal LDL less than 70 

## 2011-02-15 NOTE — Progress Notes (Signed)
   Patient ID: [name]., male    DOB: 1925-08-22, 77 y.o.   MRN: [id number]  HPI Comments: 77 year old male patient of Dr. Arlana Pouch, with a history of obesity, hip and knee discomfort, shortness of breath starting in May which improved with diuretic and long-acting nitroglycerin, history of hypertension and hyperlipidemia, CT scan from 2 years ago suggesting coronary and aortic calcification who presented for shortness of breath and edema on his last visit, s/p cardiac cath at [facility] showing critical left main disease, s/p urgent CABG 02/2011. 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 He presents for routine followup.   He reports that he is doing well. He denies any significant chest pain, shortness of breath. He does exercise 5 times a week and walks 1/4 to 1/3 of a mile at a time. No significant lower extremity edema in the morning though he does have mild edema by late afternoon. He has not been monitoring his blood pressure. He does report having loss of his peripheral eyesight on the left following surgery last year. He did follow up at [facility]. This is permanent. He had an MRI/MRA done at per his report did not show any stroke or underlying severe carotid arterial disease.   echocardiogram prior to CABG showed ejection fraction of 35%, anterior wall hypokinesis concerning for underlying ischemia or coronary artery disease.   Previous EKG shows normal sinus rhythm with rate 60 beats per minute, T-wave inversions in leads one and aVL        Review of Systems  Constitutional: Negative.   HENT: Negative.   Eyes: Negative.   Respiratory: Positive for shortness of breath.   Cardiovascular: Negative.   Gastrointestinal: Negative.   Musculoskeletal: Negative.   Skin: Negative.   Neurological: Negative.   Hematological: Negative.   Psychiatric/Behavioral: Negative.   All other systems reviewed and are negative.    BP  165/91  Pulse 59  Wt 240 lb 12.8 oz (109.226 kg)   Physical Exam  Nursing note and vitals reviewed. Constitutional: He is oriented to person, place, and time. He appears well-developed and well-nourished.       obese  HENT:  Head: Normocephalic.  Nose: Nose normal.  Mouth/Throat: Oropharynx is clear and moist.  Eyes: Conjunctivae are normal. Pupils are equal, round, and reactive to light.  Neck: Normal range of motion. Neck supple. No JVD present.  Cardiovascular: Normal rate, regular rhythm, S1 normal, S2 normal, normal heart sounds and intact distal pulses.  Exam reveals no gallop and no friction rub.   No murmur heard. Pulmonary/Chest: Effort normal and breath sounds normal. No respiratory distress. He has no wheezes. He has no rales. He exhibits no tenderness.  Abdominal: Soft. Bowel sounds are normal. He exhibits no distension. There is no tenderness.  Musculoskeletal: Normal range of motion. He exhibits no edema and no tenderness.  Lymphadenopathy:    He has no cervical adenopathy.  Neurological: He is alert and oriented to person, place, and time. Coordination normal.  Skin: Skin is warm and dry. No rash noted. No erythema.  Psychiatric: He has a normal mood and affect. His behavior is normal. Judgment and thought content normal.           Assessment and Plan

## 2011-02-15 NOTE — Patient Instructions (Addendum)
You are doing well. Blood pressure was high Please increase the clonidine to 0.2 mg twice a day Please call us if you have new issues that need to be addressed before your next appt.  Please monitor your BP at home and write down results and either call or drop off recordings in our office in 2 weeks. We will call you for a follow up Appt. In 6 months Your physician recommends that you return for a FASTING lipid profile: (Lipid/Lft)

## 2011-02-15 NOTE — Assessment & Plan Note (Signed)
Mild shortness of breath likely secondary to obesity. He is walking daily. We have encouraged him to work on his weight, continue walking.

## 2011-02-19 ENCOUNTER — Other Ambulatory Visit (INDEPENDENT_AMBULATORY_CARE_PROVIDER_SITE_OTHER): Payer: Medicare Other | Admitting: *Deleted

## 2011-02-19 DIAGNOSIS — E789 Disorder of lipoprotein metabolism, unspecified: Secondary | ICD-10-CM

## 2011-02-19 DIAGNOSIS — E785 Hyperlipidemia, unspecified: Secondary | ICD-10-CM

## 2011-02-20 ENCOUNTER — Other Ambulatory Visit: Payer: BLUE CROSS/BLUE SHIELD | Admitting: *Deleted

## 2011-02-20 LAB — LIPID PANEL
Cholesterol: 147 mg/dL (ref 0–200)
HDL: 42 mg/dL (ref >39)
Triglycerides: 116 mg/dL (ref <150)

## 2011-02-20 LAB — HEPATIC FUNCTION PANEL
ALT: 9 U/L (ref 0–53)
AST: 14 U/L (ref 0–37)
Albumin: 4.1 g/dL (ref 3.5–5.2)
Alkaline Phosphatase: 53 U/L (ref 39–117)
Indirect Bilirubin: 0.4 mg/dL (ref 0.0–0.9)
Total Protein: 6.5 g/dL (ref 6.0–8.3)

## 2011-02-28 ENCOUNTER — Telehealth: Payer: Self-pay | Admitting: *Deleted

## 2011-02-28 MED ORDER — ATORVASTATIN CALCIUM 80 MG PO TABS: 80.0000 mg | ORAL_TABLET | Freq: Every day | ORAL | Status: AC

## 2011-02-28 NOTE — Telephone Encounter (Signed)
Pt notified of results below, and he will continue Lipitor 80mg . Pt requests refill as well.

## 2011-02-28 NOTE — Telephone Encounter (Signed)
Message copied by Annia Belt on Wed Feb 28, 2011  4:07 PM ------      Message from: Phoebe Sharps      Created: Mon Feb 26, 2011  9:59 PM       Would continue lipitor 80 mg daily.      Numbers look great

## 2011-04-10 IMAGING — CR DG CHEST 2V
2 series · 2 of 2 positions shown · non-contrast
Comparison: 02/18/2010

CLINICAL DATA: CABG.

CHEST - 2 VIEW

[view not recorded (1 of 2)]
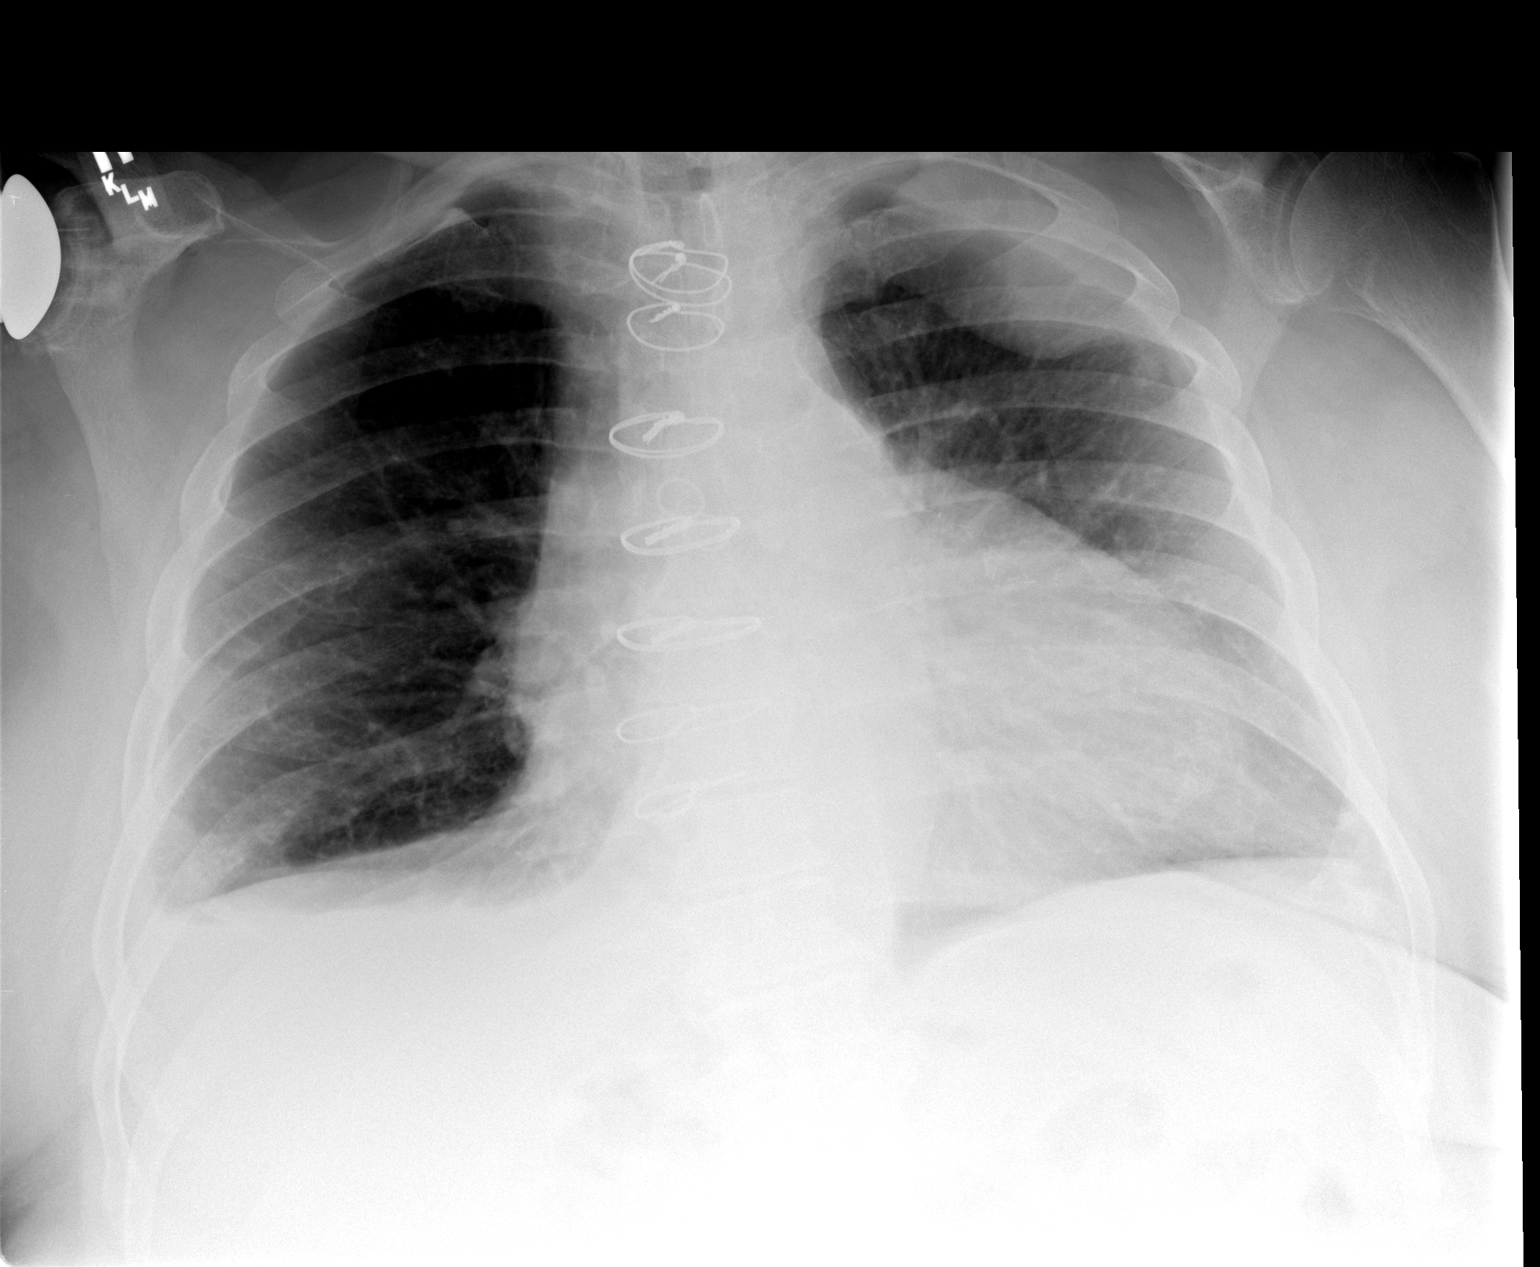

[view not recorded (2 of 2)]
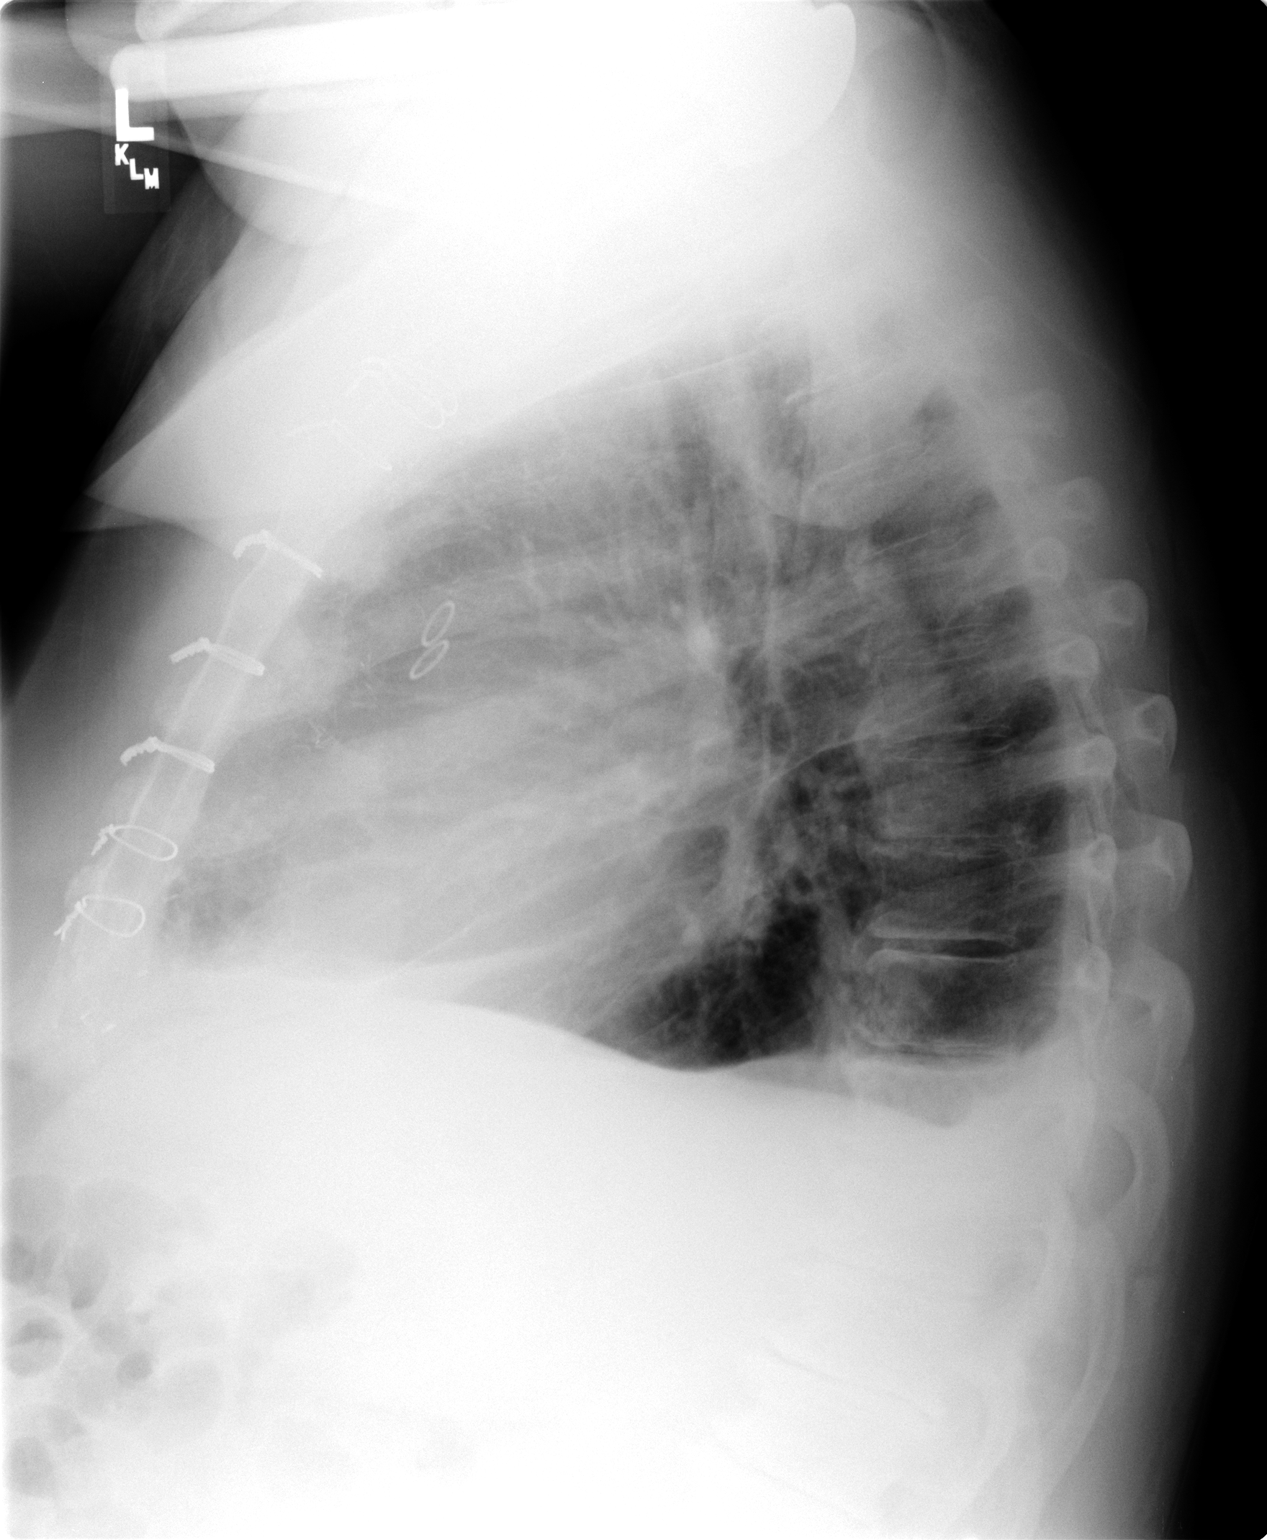

[2 of 2 positions shown; findings below may reference images not displayed]

FINDINGS: Trachea is midline.  Heart size stable.  Sternotomy wires
are unchanged in position.  Today's view is apical lordotic, which
exaggerates the pleural density at the apex of the left hemithorax.
Small right pleural effusion.  Improving bibasilar aeration.  Small
left pleural effusion.  Right shoulder arthroplasty is noted.
IMPRESSION: 1.  Small bilateral pleural effusions, left greater than right,
with loculation at the left apex, stable.
2.  Improving bibasilar aeration.

## 2011-05-08 LAB — CULTURE, RESPIRATORY W GRAM STAIN

## 2011-05-08 LAB — BLOOD GAS, ARTERIAL
Bicarbonate: 29.4 — ABNORMAL HIGH
Drawn by: 28701
O2 Content: 4
O2 Saturation: 96.3
Patient temperature: 97.2
pH, Arterial: 7.427

## 2011-05-08 LAB — COMPREHENSIVE METABOLIC PANEL
AST: 57 — ABNORMAL HIGH
Albumin: 1.5 — ABNORMAL LOW
Alkaline Phosphatase: 240 — ABNORMAL HIGH
Alkaline Phosphatase: 340 — ABNORMAL HIGH
BUN: 14
BUN: 14
CO2: 29
Chloride: 93 — ABNORMAL LOW
Chloride: 94 — ABNORMAL LOW
GFR calc Af Amer: >60
GFR calc non Af Amer: >60
Glucose, Bld: 122 — ABNORMAL HIGH
Potassium: 4.2
Potassium: 4.5
Total Bilirubin: 1
Total Protein: 5.4 — ABNORMAL LOW

## 2011-05-08 LAB — URINALYSIS, ROUTINE W REFLEX MICROSCOPIC
Ketones, ur: NEGATIVE
Nitrite: NEGATIVE
Protein, ur: 30 — AB

## 2011-05-08 LAB — BASIC METABOLIC PANEL
BUN: 16
CO2: 29
CO2: 27
Calcium: 7.7 — ABNORMAL LOW
Calcium: 8.3 — ABNORMAL LOW
Creatinine, Ser: 1.03
Creatinine, Ser: 0.89
GFR calc Af Amer: >60
GFR calc non Af Amer: >60
GFR calc non Af Amer: >60
Glucose, Bld: 115 — ABNORMAL HIGH
Glucose, Bld: 112 — ABNORMAL HIGH
Sodium: 132 — ABNORMAL LOW

## 2011-05-08 LAB — CROSSMATCH

## 2011-05-08 LAB — CLOSTRIDIUM DIFFICILE EIA: C difficile Toxins A+B, EIA: NEGATIVE

## 2011-05-08 LAB — CBC
HCT: 24.3 — ABNORMAL LOW
HCT: 29 — ABNORMAL LOW
Hemoglobin: 10 — ABNORMAL LOW
Hemoglobin: 11.4 — ABNORMAL LOW
Hemoglobin: 9.4 — ABNORMAL LOW
Hemoglobin: 9.4 — ABNORMAL LOW
MCHC: 33.4
MCHC: 33.7
MCHC: 33.4
MCV: 85.7
Platelets: 386
Platelets: 529 — ABNORMAL HIGH
RBC: 3.27 — ABNORMAL LOW
RBC: 3.34 — ABNORMAL LOW
RBC: 3.24 — ABNORMAL LOW
RDW: 14.6
RDW: 14.8
RDW: 14.9
RDW: 14.9
RDW: 14.9
WBC: 10.9 — ABNORMAL HIGH
WBC: 17.7 — ABNORMAL HIGH
WBC: 11 — ABNORMAL HIGH

## 2011-05-08 LAB — AFB CULTURE WITH SMEAR (NOT AT ARMC): Acid Fast Smear: NONE SEEN

## 2011-05-08 LAB — PROTIME-INR
INR: 1.2
Prothrombin Time: 15.4 — ABNORMAL HIGH

## 2011-05-08 LAB — FUNGUS CULTURE W SMEAR

## 2011-05-08 LAB — ABO/RH: ABO/RH(D): A POS

## 2011-05-08 LAB — URINE MICROSCOPIC-ADD ON

## 2011-07-23 ENCOUNTER — Other Ambulatory Visit: Payer: Self-pay

## 2011-07-23 MED ORDER — PRAVASTATIN SODIUM 40 MG PO TABS: 40.0000 mg | ORAL_TABLET | Freq: Every evening | ORAL | Status: DC

## 2011-11-06 ENCOUNTER — Ambulatory Visit (INDEPENDENT_AMBULATORY_CARE_PROVIDER_SITE_OTHER): Payer: Medicare Other

## 2011-11-06 VITALS — BP 200/102 | HR 68 | Ht 66.0 in | Wt 230.0 lb

## 2011-11-06 DIAGNOSIS — I10 Essential (primary) hypertension: Secondary | ICD-10-CM

## 2011-11-06 MED ORDER — CLONIDINE HCL 0.1 MG PO TABS: 0.2000 mg | ORAL_TABLET | Freq: Two times a day (BID) | ORAL | Status: DC

## 2011-11-06 NOTE — Patient Instructions (Addendum)
Need to increase clonidine to 0.1 mg two tablets (0.2 mg) twice a day. Need to increase the lisinopril to 40 mg one tablet daily.   The patient will call tomorrow with blood pressure readings. He is to make a follow up with Dr. Mariah Milling next available.  Dr. Katheran James [phone] (Dentist)

## 2011-11-07 ENCOUNTER — Encounter: Payer: Self-pay | Admitting: Nurse Practitioner

## 2011-11-07 ENCOUNTER — Ambulatory Visit (INDEPENDENT_AMBULATORY_CARE_PROVIDER_SITE_OTHER): Payer: Medicare Other | Admitting: Nurse Practitioner

## 2011-11-07 ENCOUNTER — Telehealth: Payer: Self-pay | Admitting: Cardiovascular Disease

## 2011-11-07 VITALS — BP 152/82 | HR 65 | Ht 66.0 in | Wt 237.0 lb

## 2011-11-07 DIAGNOSIS — I10 Essential (primary) hypertension: Secondary | ICD-10-CM

## 2011-11-07 DIAGNOSIS — IMO0001 Reserved for inherently not codable concepts without codable children: Secondary | ICD-10-CM | POA: Insufficient documentation

## 2011-11-07 DIAGNOSIS — I255 Ischemic cardiomyopathy: Secondary | ICD-10-CM

## 2011-11-07 DIAGNOSIS — I2589 Other forms of chronic ischemic heart disease: Secondary | ICD-10-CM

## 2011-11-07 DIAGNOSIS — I509 Heart failure, unspecified: Secondary | ICD-10-CM

## 2011-11-07 DIAGNOSIS — R0602 Shortness of breath: Secondary | ICD-10-CM

## 2011-11-07 DIAGNOSIS — I5022 Chronic systolic (congestive) heart failure: Secondary | ICD-10-CM | POA: Insufficient documentation

## 2011-11-07 DIAGNOSIS — Z951 Presence of aortocoronary bypass graft: Secondary | ICD-10-CM

## 2011-11-07 NOTE — Progress Notes (Signed)
Patient Name: [name] Date of Encounter: 11/07/2011  Primary Care Provider:  Clayborn Bigness, MD, MD Primary Cardiologist:  Concha Se, MD  Patient Profile  78 year old male with history of labile hypertension and coronary artery disease who presents for hypertension followup.  Problem List   Past Medical History  Diagnosis Date  . Hyperlipidemia   . Labile hypertension   . CHF (congestive heart failure)   . Coronary artery disease     a. 02/2010 s/p CABG x 3 - [place]->LAD, VG->R[place], VG->D1.  . Obesity   . Ischemic cardiomyopathy   . Systolic CHF, chronic     a. 01/2010 Echo - EF 35%  . Aortic sclerosis     a. by echo 01/2010 w/ systolic murmur   Past Surgical History  Procedure Date  . Back surgery     x 2  . Shoulder surgery     right  . Knee surgery     bilateral knee surgery  . Coronary artery bypass graft 02/14/2010    x 3    Allergies  Allergies  Allergen Reactions  . Penicillins     HPI  78 year old male with the above problem list.  He has a history of hypertension that is somewhat labile readings all over the map.  He presented to his dentist 2 days ago for a dental procedure and was noted to be markedly hypertensive.  The seizure was canceled and he was advised to followup with his cardiologist.  He was seen in clinic yesterday for blood pressure check and he was quite hypertensive.  His lisinopril was changed to 40 mg daily while his clonidine was increased from 0.1-0.2 mg b.i.d.  After that visit, he went home and follow his pressures almost hourly for the remainder of the day including the early morning hours.  His pressures have been quite variable with a high of 204/99 at 2:50 AM and a low of 102/72 at 11 AM this morning.  Subjectively, he feels no different whether his pressure is high or low.  Since taking his blood pressure medications this morning his pressures have been within a better range and that nothing has been higher than 155.  In the  office, his initial reading was 152/82 and I repeated this and got between 170 and 180 systolic on both arms.  We compared this to his wrist cuff which he uses at home and it appears to be accurate.  He denies any recent chest pain, dyspnea on exertion, PND, orthopnea, dizziness, or syncope.  He does have mild lower extremity edema which he says is it comes on throughout the day and is absent.  Home Medications  Prior to Admission medications   Medication Sig Start Date End Date Taking? Authorizing Provider  aspirin 81 MG tablet Take 81 mg by mouth daily.     Yes Historical Provider, MD  atorvastatin (LIPITOR) 80 MG tablet Take 1 tablet (80 mg total) by mouth daily. 02/28/11  Yes Antonieta Iba, MD  carvedilol (COREG) 6.25 MG tablet Take 6.25 mg by mouth 2 (two) times daily with a meal.     Yes Historical Provider, MD  cloNIDine (CATAPRES) 0.1 MG tablet Take 2 tablets (0.2 mg total) by mouth 2 (two) times daily. 11/06/11  Yes Antonieta Iba, MD  furosemide (LASIX) 40 MG tablet Take 40 mg by mouth daily.     Yes Historical Provider, MD  lisinopril (PRINIVIL,ZESTRIL) 40 MG tablet Take 1 tablet (40 mg total) by mouth daily.  11/06/11  Yes Antonieta Iba, MD  naproxen (NAPROSYN) 500 MG tablet Take 500 mg by mouth 1 dose over 24 hours.     Yes Historical Provider, MD  omeprazole (PRILOSEC) 20 MG capsule Take 20 mg by mouth daily.     Yes Historical Provider, MD    Family History  Family History  Problem Relation Age of Onset  . Cancer Mother     died @ 54  . Stroke Father     died @ 15 or pna    Social History  History   Social History  . Marital Status: Married    Spouse Name: N/A    Number of Children: N/A  . Years of Education: N/A   Occupational History  . Not on file.   Social History Main Topics  . Smoking status: Never Smoker   . Smokeless tobacco: Not on file  . Alcohol Use: Yes     "very seldom" alcoholic beverage  . Drug Use: No  . Sexually Active: Not Currently    Other Topics Concern  . Not on file   Social History Narrative   Lives in [place] by himself.  Retired from the [employer] business.     Review of Systems General:  No chills, fever, night sweats or weight changes.  Cardiovascular:  He has chronic LEE as outlined above.  No chest pain, dyspnea on exertion, orthopnea, palpitations, paroxysmal nocturnal dyspnea. Dermatological: No rash, lesions/masses Respiratory: No cough, dyspnea Urologic: No hematuria, dysuria Abdominal:   No nausea, vomiting, diarrhea, bright red blood per rectum, melena, or hematemesis Neurologic:  No visual changes, wkns, changes in mental status. All other systems reviewed and are otherwise negative except as noted above.  Physical Exam  Blood pressure 152/82, pulse 65, height 5\' 6"  (1.676 m), weight 237 lb (107.502 kg).  General: Pleasant, NAD Psych: Normal affect. Neuro: Alert and oriented X 3. Moves all extremities spontaneously. HEENT: Normal  Neck: Supple without JVD.  Right sided bruit vs. Radiated murmur. Lungs:  Resp regular and unlabored, CTA. Heart: RRR no s3, s4.  2/6 SEM RUSB. Abdomen: Soft, non-tender, non-distended, BS + x 4.  Extremities: No clubbing, cyanosis.  1+ bilat LEE above the sockline.  PT/Radials 1+ and equal bilaterally.  Accessory Clinical Findings  ECG - RSR, 65, R axis, inc rbbb.  No acute st/t changes.  Assessment & Plan  1.  HTN:  Meds changed yesterday.  BP's remain labile with a swing in pressures throughout the day.  I'm reluctant to titrate his meds further today and instead prefer that he follow his pressures 3x/day for the remainder of the week and over the weekend.  He will return to clinic on 4/1 for a BP check.  In the meantime, he will continue coreg @ 6.25 bid, clonidine 0.2mg  bid, Lasix 40mg  qd, and lisinopril 40mg  qpm.  If his pressure remains elevated on Monday, there is still room to titrate his BB (HR mid 60's) and then  clonidine.  He may benefit from addition of another long acting agent (amlodipine), so that we're not seeing the steep rises and falls associated with clonidine.  2.  CAD: No chest pain.  Cont asa, bb.  ? Not on statin.  Defer to Dr. Mariah Milling in f/u.  3.  ICM/Chronic Syst CHF:  Volume looks good.  Cont bb, acei, diuretic.  Would repeat echo @ some point in near future as it's coming up on 2 yrs since last LV  assessment (which was pre-CABG).  Likely not an ICD candidate given advanced age.  Nicolasa Ducking, NP 11/07/2011, 3:39 PM

## 2011-11-07 NOTE — Telephone Encounter (Signed)
Error

## 2011-11-07 NOTE — Patient Instructions (Signed)
Blood pressure check on November 12, 2011 instead of a follow up with Dr. Mariah Milling.  Continue medications. Check blood pressure 3 times a day.

## 2011-11-12 ENCOUNTER — Ambulatory Visit (INDEPENDENT_AMBULATORY_CARE_PROVIDER_SITE_OTHER): Payer: Medicare Other

## 2011-11-12 VITALS — BP 176/82 | HR 58 | Ht 66.0 in | Wt 239.1 lb

## 2011-11-12 DIAGNOSIS — I10 Essential (primary) hypertension: Secondary | ICD-10-CM

## 2011-11-12 NOTE — Progress Notes (Signed)
Pt saw Marvis Repress on 11/08/11 and asked for pt to come in today for BP check, pt brought readings with him. Today BP 176/82, hr 58.  Readings: 11/08/11  10:15 am...102/59               1:30 pm....144/63               5:00 pm....153/81   11/09/11   9:20 am....121/67                1:30 pm.....136/82                4:00 pm.....159/76   11/10/11     4:30 pm...Marland KitchenMarland Kitchen144/85                   11/11/11     2:30 pm....164/83                  4:00 pm...Marland KitchenMarland Kitchen148/82  11/12/11       9:30 am.....133/83

## 2011-11-19 ENCOUNTER — Telehealth: Payer: Self-pay | Admitting: *Deleted

## 2011-11-19 NOTE — Telephone Encounter (Signed)
lmom to increase clonidine to 0.3 mg bid and to still keep BP diary twice daily. Danielle Rankin  New dose of Clonidine changed in the chart

## 2011-11-19 NOTE — Progress Notes (Signed)
lmom to increase clonidine to 0.3 mg bid and to still keep BP diary twice daily.  New dose of clonidine changed in the chart today

## 2011-11-27 NOTE — Telephone Encounter (Signed)
Pt came into office asking for a letter to be sent to the [place] stating why he needs the generic lipitor. [facility] not [facility] unless they have a [facility] stating this. Please send it to fax # [phone] or call [phone]. Attn : Dr Criss Alvine.

## 2011-11-28 NOTE — Telephone Encounter (Signed)
Faxed cholesterol results with notes regarding the lipitor dose to Dr. Fredonia Highland.

## 2012-07-02 ENCOUNTER — Telehealth: Payer: Self-pay | Admitting: *Deleted

## 2012-07-03 NOTE — Telephone Encounter (Signed)
Opened in error

## 2013-02-24 ENCOUNTER — Ambulatory Visit: Payer: Self-pay | Admitting: Family Medicine

## 2013-03-20 ENCOUNTER — Ambulatory Visit: Payer: Self-pay | Admitting: Family Medicine

## 2013-04-01 ENCOUNTER — Ambulatory Visit: Payer: Self-pay | Admitting: Family Medicine

## 2013-04-03 ENCOUNTER — Ambulatory Visit: Payer: Self-pay | Admitting: Family Medicine

## 2013-05-06 ENCOUNTER — Ambulatory Visit: Payer: Self-pay | Admitting: Specialist

## 2013-05-06 LAB — BODY FLUID CELL COUNT WITH DIFFERENTIAL
Basophil: 0 %
Nucleated Cell Count: 4055 /mm3
Other Cells BF: 0 %
Other Mononuclear Cells: 12 %

## 2013-05-06 LAB — APTT: Activated PTT: 30.1 secs (ref 23.6–35.9)

## 2013-05-06 LAB — PROTIME-INR: INR: 1

## 2013-05-06 LAB — GLUCOSE, SEROUS FLUID: Glucose, Body Fluid: 56 mg/dL

## 2013-05-06 LAB — LACTATE DEHYDROGENASE, PLEURAL OR PERITONEAL FLUID: LDH, Body Fluid: 719 U/L

## 2013-05-06 LAB — PROTEIN, BODY FLUID

## 2013-05-27 LAB — CULTURE, FUNGUS WITHOUT SMEAR

## 2014-10-11 ENCOUNTER — Ambulatory Visit: Payer: Self-pay | Admitting: Family Medicine

## 2016-08-17 ENCOUNTER — Encounter: Payer: Self-pay | Admitting: Emergency Medicine

## 2016-08-17 ENCOUNTER — Emergency Department: Payer: Medicare Other

## 2016-08-17 ENCOUNTER — Inpatient Hospital Stay
Admission: EM | Admit: 2016-08-17 | Discharge: 2016-08-20 | DRG: 291 | Disposition: A | Discharge status: Home / Self Care | Payer: Medicare Other | Attending: Internal Medicine | Admitting: Internal Medicine

## 2016-08-17 DIAGNOSIS — E669 Obesity, unspecified: Secondary | ICD-10-CM | POA: Diagnosis present

## 2016-08-17 DIAGNOSIS — K8012 Calculus of gallbladder with acute and chronic cholecystitis without obstruction: Secondary | ICD-10-CM | POA: Diagnosis present

## 2016-08-17 DIAGNOSIS — I110 Hypertensive heart disease with heart failure: Secondary | ICD-10-CM | POA: Diagnosis not present

## 2016-08-17 DIAGNOSIS — J9621 Acute and chronic respiratory failure with hypoxia: Secondary | ICD-10-CM | POA: Diagnosis present

## 2016-08-17 DIAGNOSIS — K811 Chronic cholecystitis: Secondary | ICD-10-CM | POA: Diagnosis not present

## 2016-08-17 DIAGNOSIS — R748 Abnormal levels of other serum enzymes: Secondary | ICD-10-CM

## 2016-08-17 DIAGNOSIS — Z9981 Dependence on supplemental oxygen: Secondary | ICD-10-CM

## 2016-08-17 DIAGNOSIS — Z809 Family history of malignant neoplasm, unspecified: Secondary | ICD-10-CM

## 2016-08-17 DIAGNOSIS — R609 Edema, unspecified: Secondary | ICD-10-CM

## 2016-08-17 DIAGNOSIS — I255 Ischemic cardiomyopathy: Secondary | ICD-10-CM | POA: Diagnosis present

## 2016-08-17 DIAGNOSIS — R778 Other specified abnormalities of plasma proteins: Secondary | ICD-10-CM

## 2016-08-17 DIAGNOSIS — I2510 Atherosclerotic heart disease of native coronary artery without angina pectoris: Secondary | ICD-10-CM | POA: Diagnosis present

## 2016-08-17 DIAGNOSIS — M7989 Other specified soft tissue disorders: Secondary | ICD-10-CM

## 2016-08-17 DIAGNOSIS — I10 Essential (primary) hypertension: Secondary | ICD-10-CM

## 2016-08-17 DIAGNOSIS — R945 Abnormal results of liver function studies: Secondary | ICD-10-CM | POA: Diagnosis present

## 2016-08-17 DIAGNOSIS — J441 Chronic obstructive pulmonary disease with (acute) exacerbation: Secondary | ICD-10-CM

## 2016-08-17 DIAGNOSIS — R7401 Elevation of levels of liver transaminase levels: Secondary | ICD-10-CM

## 2016-08-17 DIAGNOSIS — Z951 Presence of aortocoronary bypass graft: Secondary | ICD-10-CM

## 2016-08-17 DIAGNOSIS — I480 Paroxysmal atrial fibrillation: Secondary | ICD-10-CM | POA: Diagnosis present

## 2016-08-17 DIAGNOSIS — R791 Abnormal coagulation profile: Secondary | ICD-10-CM | POA: Diagnosis present

## 2016-08-17 DIAGNOSIS — R0602 Shortness of breath: Secondary | ICD-10-CM

## 2016-08-17 DIAGNOSIS — R17 Unspecified jaundice: Secondary | ICD-10-CM

## 2016-08-17 DIAGNOSIS — I248 Other forms of acute ischemic heart disease: Secondary | ICD-10-CM | POA: Diagnosis present

## 2016-08-17 DIAGNOSIS — K5900 Constipation, unspecified: Secondary | ICD-10-CM | POA: Diagnosis present

## 2016-08-17 DIAGNOSIS — I5023 Acute on chronic systolic (congestive) heart failure: Secondary | ICD-10-CM | POA: Diagnosis present

## 2016-08-17 DIAGNOSIS — R740 Nonspecific elevation of levels of transaminase and lactic acid dehydrogenase [LDH]: Secondary | ICD-10-CM | POA: Diagnosis present

## 2016-08-17 DIAGNOSIS — R7989 Other specified abnormal findings of blood chemistry: Secondary | ICD-10-CM

## 2016-08-17 DIAGNOSIS — K810 Acute cholecystitis: Secondary | ICD-10-CM

## 2016-08-17 DIAGNOSIS — Z7982 Long term (current) use of aspirin: Secondary | ICD-10-CM

## 2016-08-17 DIAGNOSIS — K8020 Calculus of gallbladder without cholecystitis without obstruction: Secondary | ICD-10-CM

## 2016-08-17 DIAGNOSIS — R600 Localized edema: Secondary | ICD-10-CM

## 2016-08-17 DIAGNOSIS — K8050 Calculus of bile duct without cholangitis or cholecystitis without obstruction: Secondary | ICD-10-CM

## 2016-08-17 DIAGNOSIS — Z823 Family history of stroke: Secondary | ICD-10-CM

## 2016-08-17 DIAGNOSIS — Z9889 Other specified postprocedural states: Secondary | ICD-10-CM

## 2016-08-17 DIAGNOSIS — Z79899 Other long term (current) drug therapy: Secondary | ICD-10-CM

## 2016-08-17 DIAGNOSIS — D649 Anemia, unspecified: Secondary | ICD-10-CM | POA: Diagnosis present

## 2016-08-17 DIAGNOSIS — I4891 Unspecified atrial fibrillation: Secondary | ICD-10-CM

## 2016-08-17 LAB — CBC WITH DIFFERENTIAL/PLATELET
BASOS ABS: 0.1 10*3/uL (ref 0–0.1)
Basophils Relative: 1 %
Eosinophils Absolute: 0 10*3/uL (ref 0–0.7)
Eosinophils Relative: 0 %
HCT: 35.2 % — ABNORMAL LOW (ref 40.0–52.0)
HEMOGLOBIN: 11.7 g/dL — AB (ref 13.0–18.0)
LYMPHS PCT: 1 %
Lymphs Abs: 0.2 10*3/uL — ABNORMAL LOW (ref 1.0–3.6)
MCH: 30.3 pg (ref 26.0–34.0)
MCHC: 33.4 g/dL (ref 32.0–36.0)
MCV: 90.8 fL (ref 80.0–100.0)
Monocytes Absolute: 0.6 10*3/uL (ref 0.2–1.0)
Monocytes Relative: 5 %
NEUTROS PCT: 93 %
Neutro Abs: 11 10*3/uL — ABNORMAL HIGH (ref 1.4–6.5)
PLATELETS: 152 10*3/uL (ref 150–440)
RBC: 3.87 MIL/uL — AB (ref 4.40–5.90)
RDW: 14.1 % (ref 11.5–14.5)
WBC: 11.9 10*3/uL — ABNORMAL HIGH (ref 3.8–10.6)

## 2016-08-17 LAB — COMPREHENSIVE METABOLIC PANEL
ALK PHOS: 203 U/L — AB (ref 38–126)
ALT: 447 U/L — AB (ref 17–63)
AST: 566 U/L — AB (ref 15–41)
Albumin: 3.4 g/dL — ABNORMAL LOW (ref 3.5–5.0)
Anion gap: 8 (ref 5–15)
BUN: 23 mg/dL — AB (ref 6–20)
CHLORIDE: 102 mmol/L (ref 101–111)
CO2: 26 mmol/L (ref 22–32)
CREATININE: 1.29 mg/dL — AB (ref 0.61–1.24)
Calcium: 8.3 mg/dL — ABNORMAL LOW (ref 8.9–10.3)
GFR calc Af Amer: 55 mL/min — ABNORMAL LOW (ref >60)
GFR, EST NON AFRICAN AMERICAN: 48 mL/min — AB (ref >60)
Glucose, Bld: 197 mg/dL — ABNORMAL HIGH (ref 65–99)
Potassium: 3.5 mmol/L (ref 3.5–5.1)
SODIUM: 136 mmol/L (ref 135–145)
Total Bilirubin: 3.1 mg/dL — ABNORMAL HIGH (ref 0.3–1.2)
Total Protein: 6.7 g/dL (ref 6.5–8.1)

## 2016-08-17 LAB — TROPONIN I: Troponin I: <0.03 ng/mL (ref <0.03)

## 2016-08-17 LAB — BRAIN NATRIURETIC PEPTIDE: B Natriuretic Peptide: 229 pg/mL — ABNORMAL HIGH (ref 0.0–100.0)

## 2016-08-17 LAB — FIBRIN DERIVATIVES D-DIMER (ARMC ONLY): Fibrin derivatives D-dimer (ARMC): 5008 — ABNORMAL HIGH (ref 0–499)

## 2016-08-17 MED ORDER — IOPAMIDOL (ISOVUE-370) INJECTION 76%
60.0000 mL | Freq: Once | INTRAVENOUS | Status: AC | PRN
  Administered 2016-08-17: 75 mL via INTRAVENOUS

## 2016-08-17 MED ORDER — FUROSEMIDE 10 MG/ML IJ SOLN
20.0000 mg | Freq: Once | INTRAMUSCULAR | Status: AC
  Administered 2016-08-17: 20 mg via INTRAVENOUS
  Filled 2016-08-17: qty 4

## 2016-08-17 NOTE — ED Notes (Signed)
Patient to CT at this time

## 2016-08-17 NOTE — ED Notes (Signed)
(+)  D-dimer results and presence of significant transaminitis  communicated with patient per direction from Marcelene Butte, MD. Patient denies PMH significant for hepatic pathologies. AST 566, ALT 447, Alk phos 203, and Tbili 3.1. Patient updated on POC, which include a CT angio of the chest and an abdominal ultrasound. Questions fielded. Aware that MD would discuss findings further with him following the imaging studies. No further questions verbalized at this time. Awaiting radiology to take patient for MD ordered studies.

## 2016-08-17 NOTE — ED Triage Notes (Signed)
Patient to ED via POV c/o shortness of breath since yesterday. Patient denies cough, congestion, fever, or chills. Patient states that his shortness of breath gets worse with exertion.

## 2016-08-18 ENCOUNTER — Inpatient Hospital Stay: Payer: Medicare Other

## 2016-08-18 ENCOUNTER — Encounter: Payer: Self-pay | Admitting: Internal Medicine

## 2016-08-18 DIAGNOSIS — I5023 Acute on chronic systolic (congestive) heart failure: Secondary | ICD-10-CM | POA: Diagnosis present

## 2016-08-18 DIAGNOSIS — Z809 Family history of malignant neoplasm, unspecified: Secondary | ICD-10-CM | POA: Diagnosis not present

## 2016-08-18 DIAGNOSIS — J441 Chronic obstructive pulmonary disease with (acute) exacerbation: Secondary | ICD-10-CM | POA: Diagnosis present

## 2016-08-18 DIAGNOSIS — K8020 Calculus of gallbladder without cholecystitis without obstruction: Secondary | ICD-10-CM | POA: Diagnosis not present

## 2016-08-18 DIAGNOSIS — K5900 Constipation, unspecified: Secondary | ICD-10-CM | POA: Diagnosis present

## 2016-08-18 DIAGNOSIS — Z951 Presence of aortocoronary bypass graft: Secondary | ICD-10-CM | POA: Diagnosis not present

## 2016-08-18 DIAGNOSIS — K811 Chronic cholecystitis: Secondary | ICD-10-CM | POA: Diagnosis present

## 2016-08-18 DIAGNOSIS — I2510 Atherosclerotic heart disease of native coronary artery without angina pectoris: Secondary | ICD-10-CM | POA: Diagnosis present

## 2016-08-18 DIAGNOSIS — R748 Abnormal levels of other serum enzymes: Secondary | ICD-10-CM | POA: Diagnosis not present

## 2016-08-18 DIAGNOSIS — R791 Abnormal coagulation profile: Secondary | ICD-10-CM | POA: Diagnosis present

## 2016-08-18 DIAGNOSIS — I480 Paroxysmal atrial fibrillation: Secondary | ICD-10-CM | POA: Diagnosis present

## 2016-08-18 DIAGNOSIS — Z823 Family history of stroke: Secondary | ICD-10-CM | POA: Diagnosis not present

## 2016-08-18 DIAGNOSIS — I110 Hypertensive heart disease with heart failure: Secondary | ICD-10-CM | POA: Diagnosis present

## 2016-08-18 DIAGNOSIS — Z7982 Long term (current) use of aspirin: Secondary | ICD-10-CM | POA: Diagnosis not present

## 2016-08-18 DIAGNOSIS — I255 Ischemic cardiomyopathy: Secondary | ICD-10-CM | POA: Diagnosis present

## 2016-08-18 DIAGNOSIS — Z79899 Other long term (current) drug therapy: Secondary | ICD-10-CM | POA: Diagnosis not present

## 2016-08-18 DIAGNOSIS — I248 Other forms of acute ischemic heart disease: Secondary | ICD-10-CM | POA: Diagnosis present

## 2016-08-18 DIAGNOSIS — D649 Anemia, unspecified: Secondary | ICD-10-CM | POA: Diagnosis present

## 2016-08-18 DIAGNOSIS — J9621 Acute and chronic respiratory failure with hypoxia: Secondary | ICD-10-CM | POA: Diagnosis present

## 2016-08-18 DIAGNOSIS — Z9889 Other specified postprocedural states: Secondary | ICD-10-CM | POA: Diagnosis not present

## 2016-08-18 DIAGNOSIS — Z9981 Dependence on supplemental oxygen: Secondary | ICD-10-CM | POA: Diagnosis not present

## 2016-08-18 DIAGNOSIS — R740 Nonspecific elevation of levels of transaminase and lactic acid dehydrogenase [LDH]: Secondary | ICD-10-CM | POA: Diagnosis present

## 2016-08-18 DIAGNOSIS — K8012 Calculus of gallbladder with acute and chronic cholecystitis without obstruction: Secondary | ICD-10-CM | POA: Diagnosis present

## 2016-08-18 DIAGNOSIS — E669 Obesity, unspecified: Secondary | ICD-10-CM | POA: Diagnosis present

## 2016-08-18 DIAGNOSIS — R945 Abnormal results of liver function studies: Secondary | ICD-10-CM | POA: Diagnosis present

## 2016-08-18 LAB — COMPREHENSIVE METABOLIC PANEL
ALK PHOS: 207 U/L — AB (ref 38–126)
ALK PHOS: 200 U/L — AB (ref 38–126)
ALT: 401 U/L — AB (ref 17–63)
ALT: 279 U/L — AB (ref 17–63)
AST: 374 U/L — AB (ref 15–41)
AST: 212 U/L — AB (ref 15–41)
Albumin: 3.4 g/dL — ABNORMAL LOW (ref 3.5–5.0)
Albumin: 3.2 g/dL — ABNORMAL LOW (ref 3.5–5.0)
Anion gap: 6 (ref 5–15)
Anion gap: 8 (ref 5–15)
BILIRUBIN TOTAL: 3 mg/dL — AB (ref 0.3–1.2)
BUN: 22 mg/dL — AB (ref 6–20)
BUN: 22 mg/dL — AB (ref 6–20)
CALCIUM: 8.7 mg/dL — AB (ref 8.9–10.3)
CALCIUM: 8.4 mg/dL — AB (ref 8.9–10.3)
CO2: 27 mmol/L (ref 22–32)
CO2: 28 mmol/L (ref 22–32)
CREATININE: 1.09 mg/dL (ref 0.61–1.24)
CREATININE: 1.05 mg/dL (ref 0.61–1.24)
Chloride: 105 mmol/L (ref 101–111)
Chloride: 99 mmol/L — ABNORMAL LOW (ref 101–111)
GFR calc non Af Amer: 59 mL/min — ABNORMAL LOW (ref >60)
Glucose, Bld: 101 mg/dL — ABNORMAL HIGH (ref 65–99)
Glucose, Bld: 177 mg/dL — ABNORMAL HIGH (ref 65–99)
Potassium: 3.6 mmol/L (ref 3.5–5.1)
Potassium: 3.5 mmol/L (ref 3.5–5.1)
SODIUM: 138 mmol/L (ref 135–145)
Sodium: 135 mmol/L (ref 135–145)
TOTAL PROTEIN: 6.4 g/dL — AB (ref 6.5–8.1)
Total Bilirubin: 3.5 mg/dL — ABNORMAL HIGH (ref 0.3–1.2)
Total Protein: 6.7 g/dL (ref 6.5–8.1)

## 2016-08-18 LAB — CBC
HCT: 36.2 % — ABNORMAL LOW (ref 40.0–52.0)
Hemoglobin: 12.3 g/dL — ABNORMAL LOW (ref 13.0–18.0)
MCH: 30.6 pg (ref 26.0–34.0)
MCHC: 34.1 g/dL (ref 32.0–36.0)
MCV: 89.7 fL (ref 80.0–100.0)
PLATELETS: 162 10*3/uL (ref 150–440)
RBC: 4.03 MIL/uL — AB (ref 4.40–5.90)
RDW: 14.2 % (ref 11.5–14.5)
WBC: 9.3 10*3/uL (ref 3.8–10.6)

## 2016-08-18 LAB — TROPONIN I
Troponin I: 0.03 ng/mL (ref <0.03)
Troponin I: 0.03 ng/mL (ref <0.03)

## 2016-08-18 LAB — BILIRUBIN, DIRECT: BILIRUBIN DIRECT: 1.9 mg/dL — AB (ref 0.1–0.5)

## 2016-08-18 MED ORDER — SODIUM CHLORIDE 0.9% FLUSH
3.0000 mL | Freq: Two times a day (BID) | INTRAVENOUS | Status: DC
  Administered 2016-08-18 – 2016-08-19 (×4): 3 mL via INTRAVENOUS

## 2016-08-18 MED ORDER — ALBUTEROL SULFATE (2.5 MG/3ML) 0.083% IN NEBU
2.5000 mL | INHALATION_SOLUTION | RESPIRATORY_TRACT | Status: DC
  Administered 2016-08-18 – 2016-08-19 (×2): 2.5 mL via RESPIRATORY_TRACT
  Filled 2016-08-18 (×2): qty 3

## 2016-08-18 MED ORDER — BUDESONIDE 0.25 MG/2ML IN SUSP
0.2500 mg | Freq: Two times a day (BID) | RESPIRATORY_TRACT | Status: DC
  Administered 2016-08-19 – 2016-08-20 (×3): 0.25 mg via RESPIRATORY_TRACT
  Filled 2016-08-18 (×4): qty 2

## 2016-08-18 MED ORDER — SODIUM CHLORIDE 0.9 % IV SOLN: 250.0000 mL | INTRAVENOUS | Status: DC | PRN

## 2016-08-18 MED ORDER — ONDANSETRON HCL 4 MG PO TABS: 4.0000 mg | ORAL_TABLET | Freq: Four times a day (QID) | ORAL | Status: DC | PRN

## 2016-08-18 MED ORDER — POTASSIUM CHLORIDE CRYS ER 10 MEQ PO TBCR
10.0000 meq | EXTENDED_RELEASE_TABLET | Freq: Every day | ORAL | Status: DC
  Administered 2016-08-18 – 2016-08-20 (×3): 10 meq via ORAL
  Filled 2016-08-18 (×3): qty 1

## 2016-08-18 MED ORDER — CIPROFLOXACIN IN D5W 400 MG/200ML IV SOLN
400.0000 mg | Freq: Two times a day (BID) | INTRAVENOUS | Status: DC
  Administered 2016-08-18: 400 mg via INTRAVENOUS
  Filled 2016-08-18 (×3): qty 200

## 2016-08-18 MED ORDER — MORPHINE SULFATE (PF) 4 MG/ML IV SOLN
2.0000 mg | INTRAVENOUS | Status: DC | PRN
  Administered 2016-08-18: 2 mg via INTRAVENOUS
  Filled 2016-08-18: qty 1

## 2016-08-18 MED ORDER — FUROSEMIDE 10 MG/ML IJ SOLN
20.0000 mg | Freq: Once | INTRAMUSCULAR | Status: AC
  Administered 2016-08-18: 20 mg via INTRAVENOUS
  Filled 2016-08-18: qty 4

## 2016-08-18 MED ORDER — LEVOFLOXACIN IN D5W 500 MG/100ML IV SOLN
500.0000 mg | INTRAVENOUS | Status: DC
  Administered 2016-08-19: 500 mg via INTRAVENOUS
  Filled 2016-08-18: qty 100

## 2016-08-18 MED ORDER — METHYLPREDNISOLONE SODIUM SUCC 125 MG IJ SOLR
60.0000 mg | INTRAMUSCULAR | Status: DC
  Administered 2016-08-18 – 2016-08-19 (×2): 60 mg via INTRAVENOUS
  Filled 2016-08-18 (×2): qty 2

## 2016-08-18 MED ORDER — ALBUTEROL SULFATE (2.5 MG/3ML) 0.083% IN NEBU: 2.5000 mL | INHALATION_SOLUTION | Freq: Four times a day (QID) | RESPIRATORY_TRACT | Status: DC | PRN

## 2016-08-18 MED ORDER — KETOROLAC TROMETHAMINE 30 MG/ML IJ SOLN
15.0000 mg | Freq: Once | INTRAMUSCULAR | Status: AC
  Administered 2016-08-18: 15 mg via INTRAVENOUS
  Filled 2016-08-18: qty 1

## 2016-08-18 MED ORDER — ONDANSETRON HCL 4 MG/2ML IJ SOLN: 4.0000 mg | Freq: Four times a day (QID) | INTRAMUSCULAR | Status: DC | PRN

## 2016-08-18 MED ORDER — METRONIDAZOLE IN NACL 5-0.79 MG/ML-% IV SOLN
500.0000 mg | Freq: Three times a day (TID) | INTRAVENOUS | Status: DC
  Administered 2016-08-18 – 2016-08-20 (×6): 500 mg via INTRAVENOUS
  Filled 2016-08-18 (×10): qty 100

## 2016-08-18 MED ORDER — MAGNESIUM OXIDE 400 (241.3 MG) MG PO TABS
400.0000 mg | ORAL_TABLET | Freq: Every day | ORAL | Status: DC
  Administered 2016-08-18 – 2016-08-19 (×2): 400 mg via ORAL
  Filled 2016-08-18 (×2): qty 1

## 2016-08-18 MED ORDER — SODIUM CHLORIDE 0.9% FLUSH
3.0000 mL | INTRAVENOUS | Status: DC | PRN
  Administered 2016-08-19: 3 mL via INTRAVENOUS
  Filled 2016-08-18: qty 3

## 2016-08-18 MED ORDER — FUROSEMIDE 10 MG/ML IJ SOLN
40.0000 mg | Freq: Two times a day (BID) | INTRAMUSCULAR | Status: DC
  Administered 2016-08-18 – 2016-08-20 (×4): 40 mg via INTRAVENOUS
  Filled 2016-08-18 (×5): qty 4

## 2016-08-18 NOTE — ED Notes (Signed)
Patient observed resting in bed with NAD noted. Visitor at bedside; recliner previously provided by this RN. VSS; see VSFS. No anticipated needs identified. Will continue to monitor.

## 2016-08-18 NOTE — Consult Note (Signed)
[facility] Cardiology  CARDIOLOGY CONSULT NOTE  Patient ID: [name]. MRN: [id number] DOB/AGE: 81-10-1925 81 y.o.  Admit date: 08/17/2016 Referring Physician Winona Legato Primary Physician [facility] Primary Cardiologist [facility] Reason for Consultation Exertional shortness of breath  HPI: 83 year old gentleman referred for evaluation of exertional shortness of breath. The patient reports that he was in his usual state of health until yesterday when he developed abdominal discomfort following breakfast. Later that same day, the patient experienced exertional shortness of breath and was brought to [facility] emergency room. CT angiogram was performed which ruled out pulmonary emboli, but revealed evidence for cholelithiasis and pericholecystic fluid. Admission labs were notable for negative troponin less than 0.03, with elevated AST of 566 and ALT of 447. The patient was also noted to be in atrial fibrillation, with controlled ventricular rate, asymptomatic.  Review of systems complete and found to be negative unless listed above     Past Medical History:  Diagnosis Date  . Aortic sclerosis    a. by echo 01/2010 w/ systolic murmur  . CHF (congestive heart failure) (HCC)   . Coronary artery disease    a. 02/2010 s/p CABG x 3 - [place]->LAD, VG->Ramus, VG->D1.  Marland Kitchen Hyperlipidemia   . Ischemic cardiomyopathy   . Labile hypertension   . Obesity   . Systolic CHF, chronic (HCC)    a. 01/2010 Echo - EF 35%    Past Surgical History:  Procedure Laterality Date  . APPENDECTOMY    . BACK SURGERY     x 2  . CORONARY ARTERY BYPASS GRAFT  02/14/2010   x 3  . FRACTURE SURGERY    . KNEE SURGERY     bilateral knee surgery  . SHOULDER SURGERY     right    Prescriptions Prior to Admission  Medication Sig Dispense Refill Last Dose  . albuterol (PROVENTIL) (2.5 MG/3ML) 0.083% nebulizer solution Inhale 2.5 mLs into the lungs every 6 (six) hours as needed.   08/16/2016 at 2000  . aspirin 81 MG tablet Take 81 mg  by mouth daily.     08/17/2016 at 0800  . atorvastatin (LIPITOR) 80 MG tablet Take 1 tablet (80 mg total) by mouth daily. 30 tablet 6 08/16/2016 at 2000  . carvedilol (COREG) 12.5 MG tablet Take 12.5 mg by mouth 2 (two) times daily with a meal.    unknown at unknown  . furosemide (LASIX) 40 MG tablet Take 40 mg by mouth daily.     prn at prn  . magnesium oxide (MAG-OX) 400 MG tablet Take 400 mg by mouth daily.   08/16/2016 at 2000  . omeprazole (PRILOSEC) 20 MG capsule Take 20 mg by mouth daily.     08/17/2016 at 0800   . potassium chloride (KLOR-CON 10) 10 MEQ tablet Take 10 mEq by mouth daily.   08/16/2016 at 2000   Social History   Social History  . Marital status: Divorced    Spouse name: N/A  . Number of children: N/A  . Years of education: N/A   Occupational History  . retired    Social History Main Topics  . Smoking status: Never Smoker  . Smokeless tobacco: Never Used  . Alcohol use Yes     Comment: "very seldom" alcoholic beverage  . Drug use: No  . Sexual activity: Not Currently   Other Topics Concern  . Not on file   Social History Narrative   Lives in [place] by himself.  Retired from the [employer]  business.    Family History  Problem Relation Age of Onset  . Cancer Mother     died @ 44  . Stroke Father     died @ 104 or pna      Review of systems complete and found to be negative unless listed above      PHYSICAL EXAM  General: Well developed, well nourished, in no acute distress HEENT:  Normocephalic and atramatic Neck:  No JVD.  Lungs: Clear bilaterally to auscultation and percussion. Heart: HRRR . Normal S1 and S2 without gallops or murmurs.  Abdomen: Bowel sounds are positive, abdomen soft and non-tender  Msk:  Back normal, normal gait. Normal strength and tone for age. Extremities: No clubbing, cyanosis or edema.   Neuro: Alert and oriented X 3. Psych:  Good affect, responds appropriately  Labs:   Lab Results  Component  Value Date   WBC 9.3 08/18/2016   HGB 12.3 (L) 08/18/2016   HCT 36.2 (L) 08/18/2016   MCV 89.7 08/18/2016   PLT 162 08/18/2016    Recent Labs Lab 08/18/16 0523  NA 138  K 3.6  CL 105  CO2 27  BUN 22*  CREATININE 1.09  CALCIUM 8.7*  PROT 6.7  BILITOT 3.5*  ALKPHOS 207*  ALT 401*  AST 374*  GLUCOSE 101*   Lab Results  Component Value Date   TROPONINI <0.03 08/18/2016    Lab Results  Component Value Date   CHOL 147 02/19/2011   CHOL 227 (H) 08/29/2010   CHOL  02/16/2010    101        ATP III CLASSIFICATION:  <200     mg/dL   Desirable  161-096  mg/dL   Borderline High  >=045    mg/dL   High          Lab Results  Component Value Date   HDL 42 02/19/2011   HDL 46 08/29/2010   HDL 30 (L) 02/16/2010   Lab Results  Component Value Date   LDLCALC 82 02/19/2011   LDLCALC 154 (H) 08/29/2010   LDLCALC  02/16/2010    56        Total Cholesterol/HDL:CHD Risk Coronary Heart Disease Risk Table                     Men   Women  1/2 Average Risk   3.4   3.3  Average Risk       5.0   4.4  2 X Average Risk   9.6   7.1  3 X Average Risk  23.4   11.0        Use the calculated Patient Ratio above and the CHD Risk Table to determine the patient's CHD Risk.        ATP III CLASSIFICATION (LDL):  <100     mg/dL   Optimal  409-811  mg/dL   Near or Above                    Optimal  130-159  mg/dL   Borderline  914-782  mg/dL   High  >956     mg/dL   Very High   Lab Results  Component Value Date   TRIG 116 02/19/2011   TRIG 133 08/29/2010   TRIG 75 02/16/2010   Lab Results  Component Value Date   CHOLHDL 3.5 02/19/2011   CHOLHDL 4.9 Ratio 08/29/2010   CHOLHDL 3.4 02/16/2010   No results found for: LDLDIRECT  Radiology: Dg Chest 2 View  Result Date: 08/17/2016 CLINICAL DATA:  Shortness of breath. EXAM: CHEST  2 VIEW COMPARISON:  10/11/2014. FINDINGS: Prior CABG. Cardiomegaly with normal pulmonary vascularity. Mild pleural-parenchymal thickening consistent with  scarring again noted. No change. IMPRESSION: 1.  Prior CABG.  Cardiomegaly with normal pulmonary vascularity. 2. Mild bibasilar pleuroparenchymal thickening noted consistent scarring. No change. Electronically Signed   By: Maisie Fus  Register   On: 08/17/2016 16:43   Ct Angio Chest Pe W Or Wo Contrast  Result Date: 08/17/2016 CLINICAL DATA:  Dyspnea, onset yesterday. EXAM: CT ANGIOGRAPHY CHEST WITH CONTRAST TECHNIQUE: Multidetector CT imaging of the chest was performed using the standard protocol during bolus administration of intravenous contrast. Multiplanar CT image reconstructions and MIPs were obtained to evaluate the vascular anatomy. CONTRAST:  75 mL Isovue 370 intravenous COMPARISON:  04/03/2013 FINDINGS: Cardiovascular: Satisfactory opacification of the pulmonary arteries to the segmental level. No evidence of pulmonary embolism. Stable cardiomegaly. Atherosclerotic calcifications of the aorta and coronary arteries. No pericardial effusion. Mediastinum/Nodes: No enlarged mediastinal, hilar, or axillary lymph nodes. Thyroid gland, trachea, and esophagus demonstrate no significant findings. Lungs/Pleura: Chronic loculated effusion in the right posterior base with adjacent pleuroparenchymal scarring. Milder linear scarring in the left base. This is all unchanged from 2014. No new opacities. Airways are patent and normal in caliber. Upper Abdomen: Hiatal hernia. There is gallbladder mural thickening and pericholecystic edema/ fluid, as well as multiple intraluminal gallbladder calculi. The mural thickening and adjacent edema is new, and is suspicious for cholecystitis. Musculoskeletal: No significant skeletal lesion Review of the MIP images confirms the above findings. IMPRESSION: 1. Negative for pulmonary embolism or other acute vascular abnormality. 2. Chronic loculated pleural collection in the right posterior base with adjacent pleuroparenchymal scarring, unchanged. 3. Gallbladder mural thickening and  pericholecystic edema. Cholelithiasis. Findings are concerning for acute cholecystitis. Recommend right upper quadrant ultrasound. 4. Hiatal hernia. 5. These results will be called to the ordering clinician or representative by the Radiologist Assistant, and communication documented in the PACS or zVision Dashboard. Electronically Signed   By: Ellery Plunk M.D.   On: 08/17/2016 23:30   US Abdomen Limited Ruq  Result Date: 08/18/2016 CLINICAL DATA:  Initial evaluation for elevated LFTs. EXAM: US ABDOMEN LIMITED - RIGHT UPPER QUADRANT COMPARISON:  None available. FINDINGS: Gallbladder: Multiple echogenic stones present within the gallbladder lumen, largest of which measures approximately 8 mm. Gallbladder wall is markedly thickened to 11 mm. Suggestion of edema/fluid within the gallbladder wall as well. No sonographic Murphy sign elicited on exam. Common bile duct: Diameter: 5.6 mm Liver: No focal lesion identified. Within normal limits in parenchymal echogenicity. Evaluation of the left lobe somewhat limited due to shadowing bowel gas. Probable right pleural effusion noted. Incidental note made of a 3.4 x 4.1 x 2.9 cm anechoic right renal cyst. IMPRESSION: 1. Cholelithiasis with marked gallbladder wall thickening up to 11 mm. Clinical correlation for possible acute cholecystitis recommended. No biliary dilatation. 2. Right pleural effusion. 3. 3.4 x 4.1 x 2.9 cm right renal cyst. Electronically Signed   By: Rise Mu M.D.   On: 08/18/2016 00:21    EKG: atrial fibrillation  ASSESSMENT AND PLAN:   1. Exertional dyspnea, and the absence of fluid overload or evidence for congestive heart failure,with nondiagnostic ECG, negative troponin, in the setting of acute cholecystitis 2. Atrial fibrillation, with controlled ventricular rate, in the setting of acute cholecystitis 3. Acute cholecystitis, may require transfer for ERCP  Recommendations  1. Agree with current therapy 2.  Defer full dose  anticoagulation at this time 3. Await GI consult, and possible transfer for ERCP  Signed: Marcina Millardlexander Yaden Seith MD,PhD, [facility]ACC 08/18/2016, 11:11 AM

## 2016-08-18 NOTE — Consult Note (Signed)
Midge Minium, MD [facility]  [address] [place], [place] [address] Phone: [phone] Fax : [phone]  Consultation  Referring Provider:     No ref. provider found Primary Care Physician:  Dortha Kern, MD Primary Gastroenterologist:  None         Reason for Consultation:     Elevated LFTs  Date of Admission:  08/17/2016 Date of Consultation:  08/18/2016         HPI:   [name]. is a 83 y.o. male with PMH as detailed below admitted yesterday with SOB and upper abdominal pain. He reports having epigastric discomfort after breakfast yesterday, which subsided before lunch after having sweetened tea, but developed SOB which prompted him to go to Er. CT was done initially to evaluate for PE which was negative but incidentally showed cholelithiasis and distended gall bladder with pericholecystic fluid. Lfts were elevated AP 203, AST 566, ALT 447, TB 3.1, with mildly elevated WBCs. He was also found to have new onset of a Fib. Surgery was consulted for possible acute cholecystitis. GI was consulted to evaluate for choledocholithiasis.   He is comfortable today lying in bed, his friends are bedside, He denied f/c/n/v/abdominal pain/jaundice, dark urine, clay colored stools  Past Medical History:  Diagnosis Date  . Aortic sclerosis    a. by echo 01/2010 w/ systolic murmur  . CHF (congestive heart failure) (HCC)   . Coronary artery disease    a. 02/2010 s/p CABG x 3 - [place]->LAD, VG->Ramus, VG->D1.  Marland Kitchen Hyperlipidemia   . Ischemic cardiomyopathy   . Labile hypertension   . Obesity   . Systolic CHF, chronic (HCC)    a. 01/2010 Echo - EF 35%    Past Surgical History:  Procedure Laterality Date  . APPENDECTOMY    . BACK SURGERY     x 2  . CORONARY ARTERY BYPASS GRAFT  02/14/2010   x 3  . FRACTURE SURGERY    . KNEE SURGERY     bilateral knee surgery  . SHOULDER SURGERY     right    Prior to Admission medications   Medication Sig Start Date End Date Taking?  Authorizing Provider  albuterol (PROVENTIL) (2.5 MG/3ML) 0.083% nebulizer solution Inhale 2.5 mLs into the lungs every 6 (six) hours as needed.   Yes Historical Provider, MD  aspirin 81 MG tablet Take 81 mg by mouth daily.     Yes Historical Provider, MD  atorvastatin (LIPITOR) 80 MG tablet Take 1 tablet (80 mg total) by mouth daily. 02/28/11  Yes Antonieta Iba, MD  carvedilol (COREG) 12.5 MG tablet Take 12.5 mg by mouth 2 (two) times daily with a meal.    Yes Historical Provider, MD  furosemide (LASIX) 40 MG tablet Take 40 mg by mouth daily.     Yes Historical Provider, MD  magnesium oxide (MAG-OX) 400 MG tablet Take 400 mg by mouth daily.   Yes Historical Provider, MD  omeprazole (PRILOSEC) 20 MG capsule Take 20 mg by mouth daily.     Yes Historical Provider, MD  potassium chloride (KLOR-CON 10) 10 MEQ tablet Take 10 mEq by mouth daily. 02/25/15  Yes Historical Provider, MD    Family History  Problem Relation Age of Onset  . Cancer Mother     died @ 24  . Stroke Father     died @ 54 or pna     Social History  Substance Use Topics  . Smoking status: Never Smoker  .  Smokeless tobacco: Never Used  . Alcohol use Yes     Comment: "very seldom" alcoholic beverage    Allergies as of 08/17/2016  . (No Active Allergies)    Review of Systems:    All systems reviewed and negative except where noted in HPI.   Physical Exam:  Vital signs in last 24 hours: Temp:  [97.9 F (36.6 C)-98.9 F (37.2 C)] 97.9 F (36.6 C) (01/06 1147) Pulse Rate:  [62-91] 62 (01/06 1147) Resp:  [15-27] 16 (01/06 1147) BP: (94-178)/(55-103) 157/63 (01/06 1147) SpO2:  [95 %-98 %] 97 % (01/06 1147) Weight:  [90.7 kg (200 lb)-92.7 kg (204 lb 4.8 oz)] 92.7 kg (204 lb 4.8 oz) (01/06 0526) Last BM Date: 08/17/16 General:   Pleasant, cooperative in NAD Head:  Normocephalic and atraumatic. Eyes:   No icterus.   Conjunctiva pink. PERRLA. Ears:  Normal auditory acuity. Neck:  Supple; no masses or  thyroidomegaly Lungs: Respirations even and unlabored. Lungs clear to auscultation bilaterally.   No wheezes, crackles, or rhonchi.  Heart:  Regular rate and rhythm;  Without murmur, clicks, rubs or gallops Abdomen:  Soft, nondistended, nontender. Normal bowel sounds. No appreciable masses or hepatomegaly.  No rebound or guarding.  Rectal:  Not performed. Msk:  Symmetrical without gross deformities. Extremities:  Without edema, cyanosis or clubbing. Neurologic:  Alert and oriented x3;  grossly normal neurologically. Skin:  Intact without significant lesions or rashes. Cervical Nodes:  No significant cervical adenopathy. Psych:  Alert and cooperative. Normal affect.  LAB RESULTS:  Recent Labs  08/17/16 2141 08/18/16 0523  WBC 11.9* 9.3  HGB 11.7* 12.3*  HCT 35.2* 36.2*  PLT 152 162   BMET  Recent Labs  08/17/16 2141 08/18/16 0523  NA 136 138  K 3.5 3.6  CL 102 105  CO2 26 27  GLUCOSE 197* 101*  BUN 23* 22*  CREATININE 1.29* 1.09  CALCIUM 8.3* 8.7*   LFT  Recent Labs  08/18/16 0523  PROT 6.7  ALBUMIN 3.4*  AST 374*  ALT 401*  ALKPHOS 207*  BILITOT 3.5*   PT/INR No results for input(s): LABPROT, INR in the last 72 hours.  STUDIES: Dg Chest 2 View  Result Date: 08/17/2016 CLINICAL DATA:  Shortness of breath. EXAM: CHEST  2 VIEW COMPARISON:  10/11/2014. FINDINGS: Prior CABG. Cardiomegaly with normal pulmonary vascularity. Mild pleural-parenchymal thickening consistent with scarring again noted. No change. IMPRESSION: 1.  Prior CABG.  Cardiomegaly with normal pulmonary vascularity. 2. Mild bibasilar pleuroparenchymal thickening noted consistent scarring. No change. Electronically Signed   By: Maisie Fushomas  Register   On: 08/17/2016 16:43   Ct Angio Chest Pe W Or Wo Contrast  Result Date: 08/17/2016 CLINICAL DATA:  Dyspnea, onset yesterday. EXAM: CT ANGIOGRAPHY CHEST WITH CONTRAST TECHNIQUE: Multidetector CT imaging of the chest was performed using the standard protocol  during bolus administration of intravenous contrast. Multiplanar CT image reconstructions and MIPs were obtained to evaluate the vascular anatomy. CONTRAST:  75 mL Isovue 370 intravenous COMPARISON:  04/03/2013 FINDINGS: Cardiovascular: Satisfactory opacification of the pulmonary arteries to the segmental level. No evidence of pulmonary embolism. Stable cardiomegaly. Atherosclerotic calcifications of the aorta and coronary arteries. No pericardial effusion. Mediastinum/Nodes: No enlarged mediastinal, hilar, or axillary lymph nodes. Thyroid gland, trachea, and esophagus demonstrate no significant findings. Lungs/Pleura: Chronic loculated effusion in the right posterior base with adjacent pleuroparenchymal scarring. Milder linear scarring in the left base. This is all unchanged from 2014. No new opacities. Airways are patent and normal in caliber. Upper  Abdomen: Hiatal hernia. There is gallbladder mural thickening and pericholecystic edema/ fluid, as well as multiple intraluminal gallbladder calculi. The mural thickening and adjacent edema is new, and is suspicious for cholecystitis. Musculoskeletal: No significant skeletal lesion Review of the MIP images confirms the above findings. IMPRESSION: 1. Negative for pulmonary embolism or other acute vascular abnormality. 2. Chronic loculated pleural collection in the right posterior base with adjacent pleuroparenchymal scarring, unchanged. 3. Gallbladder mural thickening and pericholecystic edema. Cholelithiasis. Findings are concerning for acute cholecystitis. Recommend right upper quadrant ultrasound. 4. Hiatal hernia. 5. These results will be called to the ordering clinician or representative by the Radiologist Assistant, and communication documented in the PACS or zVision Dashboard. Electronically Signed   By: Ellery Plunk M.Glenn.   On: 08/17/2016 23:30   Mr Abdomen Mrcp Wo Contrast  Result Date: 08/18/2016 CLINICAL DATA:  83 year old male with elevated liver  function tests. Cholelithiasis and pericholecystic fluid noted on recent CT examination. EXAM: MRI ABDOMEN WITHOUT CONTRAST  (INCLUDING MRCP) TECHNIQUE: Multiplanar multisequence MR imaging of the abdomen was performed. Heavily T2-weighted images of the biliary and pancreatic ducts were obtained, and three-dimensional MRCP images were rendered by post processing. COMPARISON:  No prior abdominal MRI. Chest CT 08/17/2016. Abdominal ultrasound 08/17/2016. FINDINGS: Comment: Lack of IV gadolinium limits assessment for and characterization of visceral and/or vascular lesions. Lower chest: Small mixed signal intensity fluid collection in the lower right hemithorax, compatible with a complex right-sided pleural effusion which may have some proteinaceous/hemorrhagic contents and appears likely partially loculated. There is some increased signal intensity in the adjacent lung parenchymal, presumably atelectasis. Signal void in the sternum related to median sternotomy wires. Hepatobiliary: No definite cystic or solid hepatic lesions are identified on today's noncontrast examination. No intra or extrahepatic biliary ductal dilatation on MRCP images. Common bile duct measures 5 mm in the porta hepatis. Several small filling defects lie dependently in the gallbladder, compatible with gallstones. Gallbladder wall appears edematous measuring up to 12 mm in thickness with a trace amount of pericholecystic fluid. Gallbladder is only moderately distended. Pancreas: No pancreatic mass or peripancreatic inflammatory changes are noted on today's noncontrast CT examination. No pancreatic ductal dilatation noted on MRCP images. Spleen:  Unremarkable. Adrenals/Urinary Tract: Multiple T1 hypointense, T2 hyperintense lesions noted in the kidneys bilaterally, incompletely characterized on today's noncontrast examination, but likely to represent cysts, measuring up to 4.8 cm in the upper pole of the right kidney. No hydroureteronephrosis in  the visualized portions of the abdomen. Bilateral adrenal glands are normal in appearance. Stomach/Bowel: Visualized portions are unremarkable. Vascular/Lymphatic: No aneurysm identified in the visualized abdominal vasculature. No lymphadenopathy noted in the abdomen. Other: No significant volume of ascites noted in the visualized portions of the peritoneal cavity. Musculoskeletal: No aggressive osseous lesions are noted in the visualized portions of the skeleton. IMPRESSION: 1. Cholelithiasis with gallbladder wall edema and trace amount of pericholecystic fluid. However, gallbladder is only moderately distended. In the appropriate clinical setting, these findings could be seen in the setting of acute cholecystitis, however, these findings may alternatively simply reflect gallbladder wall edema secondary to biliary dysfunction. Clinical correlation is recommended. 2. Small complex partially loculated right-sided pleural effusion with some associated passive subsegmental atelectasis in the right lower lobe. 3. Additional incidental findings, as above. Electronically Signed   By: Trudie Reed M.Glenn.   On: 08/18/2016 13:25   US Abdomen Limited Ruq  Result Date: 08/18/2016 CLINICAL DATA:  Initial evaluation for elevated LFTs. EXAM: US ABDOMEN LIMITED - RIGHT UPPER QUADRANT  COMPARISON:  None available. FINDINGS: Gallbladder: Multiple echogenic stones present within the gallbladder lumen, largest of which measures approximately 8 mm. Gallbladder wall is markedly thickened to 11 mm. Suggestion of edema/fluid within the gallbladder wall as well. No sonographic Murphy sign elicited on exam. Common bile duct: Diameter: 5.6 mm Liver: No focal lesion identified. Within normal limits in parenchymal echogenicity. Evaluation of the left lobe somewhat limited due to shadowing bowel gas. Probable right pleural effusion noted. Incidental note made of a 3.4 x 4.1 x 2.9 cm anechoic right renal cyst. IMPRESSION: 1. Cholelithiasis  with marked gallbladder wall thickening up to 11 mm. Clinical correlation for possible acute cholecystitis recommended. No biliary dilatation. 2. Right pleural effusion. 3. 3.4 x 4.1 x 2.9 cm right renal cyst. Electronically Signed   By: Rise Mu M.Glenn.   On: 08/18/2016 00:21      Impression / Plan:   [name]. is a 83 y.o. y/o male with extensive cardiac history presents with new onset of A Fib, rate controlled, acute calculous cholecystitis and GI consulted for possible choledocholithiasis  - RUQ US showed normal caliber CBD 5.55mm - [name] is not checked, there is intermediate probability for choledocholithiasis - No evidence of acute cholangitis - Recommended MRCP and communicated with Dr Winona Legato - MRCP showed no evidence of choledocholithiasis - No indication for ERCP from GI stand point - Monitor daily LFTs - Emperic antibiotics - Follow up with surgery team for further recommendations - Please call with questions/comments/concerns  Thank you for involving me in the care of this patient.      LOS: 0 days   Glenn Donath, MD  08/18/2016, 3:50 PM

## 2016-08-18 NOTE — Progress Notes (Signed)
[facility] [place] [facility]   PATIENT NAME: Glenn PancoastRalph Thomas    MR#:  [id number]  DATE OF BIRTH:  11/02/1925  SUBJECTIVE:  CHIEF COMPLAINT:   Chief Complaint  Patient presents with  . Shortness of Breath   The patient is 83 year old male with a history significant for history of CHF, coronary artery disease, coronary artery bypass grafting, hyperlipidemia, cardiomyopathy, hypertension, who presented to the hospital with complaints of shortness of breath for the last few days, back pain, but no chest pain. He also admitted of some cough, dry him a right upper quadrant abdominal discomfort, which patient had yesterday in the morning.. On arrival to the hospital patient was noted to have elevated alkaline phosphatase, total bilirubin, liver function tests, AST,  ALT 400/500. Studies revealed thickened gallbladder wall, gallstones, but no choledocholithiasis. Patient is initiated on antibiotic therapy due to concerns of cholecystitis. He has no abdominal pain at present. He was also seen by cardiologist due to mild elevation of troponin, and felt that he had demand ischemia, but not acute coronary syndrome. Review of Systems  Constitutional: Negative for chills, fever and weight loss.  HENT: Negative for congestion.   Eyes: Negative for blurred vision and double vision.  Respiratory: Positive for cough, sputum production, shortness of breath and wheezing.   Cardiovascular: Negative for chest pain, palpitations, orthopnea, leg swelling and PND.  Gastrointestinal: Negative for abdominal pain, blood in stool, constipation, diarrhea, nausea and vomiting.  Genitourinary: Negative for dysuria, frequency, hematuria and urgency.  Musculoskeletal: Negative for falls.  Neurological: Negative for dizziness, tremors, focal weakness and headaches.  Endo/Heme/Allergies: Does not bruise/bleed easily.  Psychiatric/Behavioral: Negative for depression. The patient does not have  insomnia.     VITAL SIGNS: Blood pressure (!) 157/63, pulse 62, temperature 97.9 F (36.6 C), temperature source Oral, resp. rate 16, height 5\' 10"  (1.778 m), weight 92.7 kg (204 lb 4.8 oz), SpO2 97 %.  PHYSICAL EXAMINATION:   GENERAL:  [age] y.o.-year-old patient lying in the bed with no acute distress.  EYES: Pupils equal, round, reactive to light and accommodation. No scleral icterus. Extraocular muscles intact.  HEENT: Head atraumatic, normocephalic. Oropharynx and nasopharynx clear.  NECK:  Supple, no jugular venous distention. No thyroid enlargement, no tenderness.  LUNGS: Markedly diminished breath sounds bilaterally, few scattered wheezing, but no rales,rhonchi or crepitation. Intermittent use of accessory muscles of respiration, especially with movements and speech.  CARDIOVASCULAR: S1, S2 normal. No murmurs, rubs, or gallops.  ABDOMEN: Soft, nontender, nondistended. Bowel sounds present. No organomegaly or mass.  EXTREMITIES: No pedal edema, cyanosis, or clubbing.  NEUROLOGIC: Cranial nerves II through XII are intact. Muscle strength 5/5 in all extremities. Sensation intact. Gait not checked.  PSYCHIATRIC: The patient is alert and oriented x 3.  SKIN: No obvious rash, lesion, or ulcer.   ORDERS/RESULTS REVIEWED:   CBC  Recent Labs Lab 08/17/16 2141 08/18/16 0523  WBC 11.9* 9.3  HGB 11.7* 12.3*  HCT 35.2* 36.2*  PLT 152 162  MCV 90.8 89.7  MCH 30.3 30.6  MCHC 33.4 34.1  RDW 14.1 14.2  LYMPHSABS 0.2*  --   MONOABS 0.6  --   EOSABS 0.0  --   BASOSABS 0.1  --    ------------------------------------------------------------------------------------------------------------------  Chemistries   Recent Labs Lab 08/17/16 2141 08/18/16 0523  NA 136 138  K 3.5 3.6  CL 102 105  CO2 26 27  GLUCOSE 197* 101*  BUN 23* 22*  CREATININE 1.29* 1.09  CALCIUM 8.3* 8.7*  AST 566* 374*  ALT 447* 401*  ALKPHOS 203* 207*  BILITOT 3.1* 3.5*    ------------------------------------------------------------------------------------------------------------------ estimated creatinine clearance is 53.6 mL/min (by C-G formula based on SCr of 1.09 mg/dL). ------------------------------------------------------------------------------------------------------------------ No results for input(s): TSH, T4TOTAL, T3FREE, THYROIDAB in the last 72 hours.  Invalid input(s): FREET3  Cardiac Enzymes  Recent Labs Lab 08/17/16 2141 08/18/16 0523 08/18/16 1111  TROPONINI <0.03 <0.03 0.03*   ------------------------------------------------------------------------------------------------------------------ Invalid input(s): POCBNP ---------------------------------------------------------------------------------------------------------------  RADIOLOGY: Dg Chest 2 View  Result Date: 08/17/2016 CLINICAL DATA:  Shortness of breath. EXAM: CHEST  2 VIEW COMPARISON:  10/11/2014. FINDINGS: Prior CABG. Cardiomegaly with normal pulmonary vascularity. Mild pleural-parenchymal thickening consistent with scarring again noted. No change. IMPRESSION: 1.  Prior CABG.  Cardiomegaly with normal pulmonary vascularity. 2. Mild bibasilar pleuroparenchymal thickening noted consistent scarring. No change. Electronically Signed   By: Maisie Fus  Register   On: 08/17/2016 16:43   Ct Angio Chest Pe W Or Wo Contrast  Result Date: 08/17/2016 CLINICAL DATA:  Dyspnea, onset yesterday. EXAM: CT ANGIOGRAPHY CHEST WITH CONTRAST TECHNIQUE: Multidetector CT imaging of the chest was performed using the standard protocol during bolus administration of intravenous contrast. Multiplanar CT image reconstructions and MIPs were obtained to evaluate the vascular anatomy. CONTRAST:  75 mL Isovue 370 intravenous COMPARISON:  04/03/2013 FINDINGS: Cardiovascular: Satisfactory opacification of the pulmonary arteries to the segmental level. No evidence of pulmonary embolism. Stable cardiomegaly.  Atherosclerotic calcifications of the aorta and coronary arteries. No pericardial effusion. Mediastinum/Nodes: No enlarged mediastinal, hilar, or axillary lymph nodes. Thyroid gland, trachea, and esophagus demonstrate no significant findings. Lungs/Pleura: Chronic loculated effusion in the right posterior base with adjacent pleuroparenchymal scarring. Milder linear scarring in the left base. This is all unchanged from 2014. No new opacities. Airways are patent and normal in caliber. Upper Abdomen: Hiatal hernia. There is gallbladder mural thickening and pericholecystic edema/ fluid, as well as multiple intraluminal gallbladder calculi. The mural thickening and adjacent edema is new, and is suspicious for cholecystitis. Musculoskeletal: No significant skeletal lesion Review of the MIP images confirms the above findings. IMPRESSION: 1. Negative for pulmonary embolism or other acute vascular abnormality. 2. Chronic loculated pleural collection in the right posterior base with adjacent pleuroparenchymal scarring, unchanged. 3. Gallbladder mural thickening and pericholecystic edema. Cholelithiasis. Findings are concerning for acute cholecystitis. Recommend right upper quadrant ultrasound. 4. Hiatal hernia. 5. These results will be called to the ordering clinician or representative by the Radiologist Assistant, and communication documented in the PACS or zVision Dashboard. Electronically Signed   By: Ellery Plunk M.D.   On: 08/17/2016 23:30   Mr Abdomen Mrcp Wo Contrast  Result Date: 08/18/2016 CLINICAL DATA:  83 year old male with elevated liver function tests. Cholelithiasis and pericholecystic fluid noted on recent CT examination. EXAM: MRI ABDOMEN WITHOUT CONTRAST  (INCLUDING MRCP) TECHNIQUE: Multiplanar multisequence MR imaging of the abdomen was performed. Heavily T2-weighted images of the biliary and pancreatic ducts were obtained, and three-dimensional MRCP images were rendered by post processing.  COMPARISON:  No prior abdominal MRI. Chest CT 08/17/2016. Abdominal ultrasound 08/17/2016. FINDINGS: Comment: Lack of IV gadolinium limits assessment for and characterization of visceral and/or vascular lesions. Lower chest: Small mixed signal intensity fluid collection in the lower right hemithorax, compatible with a complex right-sided pleural effusion which may have some proteinaceous/hemorrhagic contents and appears likely partially loculated. There is some increased signal intensity in the adjacent lung parenchymal, presumably atelectasis. Signal void in the sternum related to median sternotomy wires. Hepatobiliary: No definite cystic or solid hepatic lesions are  identified on today's noncontrast examination. No intra or extrahepatic biliary ductal dilatation on MRCP images. Common bile duct measures 5 mm in the porta hepatis. Several small filling defects lie dependently in the gallbladder, compatible with gallstones. Gallbladder wall appears edematous measuring up to 12 mm in thickness with a trace amount of pericholecystic fluid. Gallbladder is only moderately distended. Pancreas: No pancreatic mass or peripancreatic inflammatory changes are noted on today's noncontrast CT examination. No pancreatic ductal dilatation noted on MRCP images. Spleen:  Unremarkable. Adrenals/Urinary Tract: Multiple T1 hypointense, T2 hyperintense lesions noted in the kidneys bilaterally, incompletely characterized on today's noncontrast examination, but likely to represent cysts, measuring up to 4.8 cm in the upper pole of the right kidney. No hydroureteronephrosis in the visualized portions of the abdomen. Bilateral adrenal glands are normal in appearance. Stomach/Bowel: Visualized portions are unremarkable. Vascular/Lymphatic: No aneurysm identified in the visualized abdominal vasculature. No lymphadenopathy noted in the abdomen. Other: No significant volume of ascites noted in the visualized portions of the peritoneal cavity.  Musculoskeletal: No aggressive osseous lesions are noted in the visualized portions of the skeleton. IMPRESSION: 1. Cholelithiasis with gallbladder wall edema and trace amount of pericholecystic fluid. However, gallbladder is only moderately distended. In the appropriate clinical setting, these findings could be seen in the setting of acute cholecystitis, however, these findings may alternatively simply reflect gallbladder wall edema secondary to biliary dysfunction. Clinical correlation is recommended. 2. Small complex partially loculated right-sided pleural effusion with some associated passive subsegmental atelectasis in the right lower lobe. 3. Additional incidental findings, as above. Electronically Signed   By: Trudie Reed M.D.   On: 08/18/2016 13:25   US Abdomen Limited Ruq  Result Date: 08/18/2016 CLINICAL DATA:  Initial evaluation for elevated LFTs. EXAM: US ABDOMEN LIMITED - RIGHT UPPER QUADRANT COMPARISON:  None available. FINDINGS: Gallbladder: Multiple echogenic stones present within the gallbladder lumen, largest of which measures approximately 8 mm. Gallbladder wall is markedly thickened to 11 mm. Suggestion of edema/fluid within the gallbladder wall as well. No sonographic Murphy sign elicited on exam. Common bile duct: Diameter: 5.6 mm Liver: No focal lesion identified. Within normal limits in parenchymal echogenicity. Evaluation of the left lobe somewhat limited due to shadowing bowel gas. Probable right pleural effusion noted. Incidental note made of a 3.4 x 4.1 x 2.9 cm anechoic right renal cyst. IMPRESSION: 1. Cholelithiasis with marked gallbladder wall thickening up to 11 mm. Clinical correlation for possible acute cholecystitis recommended. No biliary dilatation. 2. Right pleural effusion. 3. 3.4 x 4.1 x 2.9 cm right renal cyst. Electronically Signed   By: Rise Mu M.D.   On: 08/18/2016 00:21    EKG:  Orders placed or performed during the hospital encounter of 08/17/16   . ED EKG  . ED EKG    ASSESSMENT AND PLAN:  Active Problems:   CHF (congestive heart failure) (HCC)   Symptomatic cholelithiasis   Elevated liver enzymes  #1. COPD exacerbation, initiate patient on levofloxacin, Pulmicort, Solu Medrol, albuterol, follow clinically. Clinically #2. Right upper quadrant abdominal pain, cholelithiasis, no choledocholithiasis, concerning for cholecystitis versus biliary colic, initiate patient on antibiotic therapy, surgical consultation is requested, appreciated, surgery is recommended after discharge, conservative therapy only for now #3. Elevated transaminases level, total bilirubin, no obstruction, follow LFTs in the morning #4 acute renal insufficiency, improved with IV fluid administration #5. Elevated troponin, likely demand ischemia, conservative therapy per cardiologist #6. Anemia, get Hemoccult, no active bleeding was noted  Management plans discussed with the patient, family and  they are in agreement.   DRUG ALLERGIES: No Active Allergies  CODE STATUS:     Code Status Orders        Start     Ordered   08/18/16 0518  Full code  Continuous     08/18/16 0517    Code Status History    Date Active Date Inactive Code Status Order ID Comments User Context   This patient has a current code status but no historical code status.    Advance Directive Documentation   Flowsheet Row Most Recent Value  Type of Advance Directive  Healthcare Power of Attorney, Living will  Pre-existing out of facility DNR order (yellow form or pink MOST form)  No data  "MOST" Form in Place?  No data      TOTAL TIME TAKING CARE OF THIS PATIENT: 40 minutes.    Katharina Caper M.D on 08/18/2016 at 5:04 PM  Between 7am to 6pm - Pager - [phone]  After 6pm go to [email or web address] - password [email or web address] [facility]  [facility]  Office  [phone]  CC: Primary care physician; Dortha Kern, MD

## 2016-08-18 NOTE — H&P (Signed)
[facility] [place] [facility]   PATIENT NAME: [name]    MR#:  [id number]  DATE OF BIRTH:  1925-08-30  DATE OF ADMISSION:  08/17/2016  PRIMARY CARE PHYSICIAN: BLISS, Doreene Nest, MD   REQUESTING/REFERRING PHYSICIAN:   CHIEF COMPLAINT:   Chief Complaint  Patient presents with  . Shortness of Breath    HISTORY OF PRESENT ILLNESS: [name]  is a 83 y.o. male with a known history of Congestive heart failure, coronary artery disease, CABG, hyperlipidemia, cardiomyopathy, hypertension, obesity presented to the emergency room with increased shortness of breath for the last few days. Patient also had some back pain was aching in nature. Has history of orthopnea. No complaints of any chest pain. Patient is on oxygen at bedtime via nasal cannula. No history of any fever or chills. No history of recent travel or sick contacts at home. Patient was evaluated in the emergency room was found to have elevated d-dimer he was worked up with CT angiography of the chest which showed no pulmonary embolism. His liver function tests were elevated and the CT scan showed cholelithiasis and pericholecystic fluid. Patient was also found to be in atrial fibrillation the emergency room. Hospitalist service was consulted for further care of the patient.  PAST MEDICAL HISTORY:   Past Medical History:  Diagnosis Date  . Aortic sclerosis    a. by echo 01/2010 w/ systolic murmur  . CHF (congestive heart failure) (HCC)   . Coronary artery disease    a. 02/2010 s/p CABG x 3 - [place]->LAD, VG->Ramus, VG->D1.  Marland Kitchen Hyperlipidemia   . Ischemic cardiomyopathy   . Labile hypertension   . Obesity   . Systolic CHF, chronic (HCC)    a. 01/2010 Echo - EF 35%    PAST SURGICAL HISTORY: Past Surgical History:  Procedure Laterality Date  . BACK SURGERY     x 2  . CORONARY ARTERY BYPASS GRAFT  02/14/2010   x 3  . FRACTURE SURGERY    . KNEE SURGERY     bilateral knee surgery  . SHOULDER SURGERY      right    SOCIAL HISTORY:  Social History  Substance Use Topics  . Smoking status: Never Smoker  . Smokeless tobacco: Never Used  . Alcohol use Yes     Comment: "very seldom" alcoholic beverage    FAMILY HISTORY:  Family History  Problem Relation Age of Onset  . Cancer Mother     died @ 55  . Stroke Father     died @ 68 or pna    DRUG ALLERGIES: No Active Allergies  REVIEW OF SYSTEMS:   CONSTITUTIONAL: No fever, has weakness.  EYES: No blurred or double vision.  EARS, NOSE, AND THROAT: No tinnitus or ear pain.  RESPIRATORY: No cough,has shortness of breath,  No wheezing or hemoptysis.  CARDIOVASCULAR: No chest pain, has orthopnea, edema.  GASTROINTESTINAL: No nausea, vomiting, diarrhea or abdominal pain.  GENITOURINARY: No dysuria, hematuria.  ENDOCRINE: No polyuria, nocturia,  HEMATOLOGY: No anemia, easy bruising or bleeding SKIN: No rash or lesion. MUSCULOSKELETAL: No joint pain or arthritis.   NEUROLOGIC: No tingling, numbness, weakness.  PSYCHIATRY: No anxiety or depression.   MEDICATIONS AT HOME:  Prior to Admission medications   Medication Sig Start Date End Date Taking? Authorizing Provider  albuterol (PROVENTIL) (2.5 MG/3ML) 0.083% nebulizer solution Inhale 2.5 mLs into the lungs every 6 (six) hours as needed.   Yes Historical Provider, MD  aspirin 81 MG  tablet Take 81 mg by mouth daily.     Yes Historical Provider, MD  atorvastatin (LIPITOR) 80 MG tablet Take 1 tablet (80 mg total) by mouth daily. 02/28/11  Yes Antonieta Iba, MD  furosemide (LASIX) 40 MG tablet Take 40 mg by mouth daily.     Yes Historical Provider, MD  magnesium oxide (MAG-OX) 400 MG tablet Take 400 mg by mouth daily.   Yes Historical Provider, MD  omeprazole (PRILOSEC) 20 MG capsule Take 20 mg by mouth daily.     Yes Historical Provider, MD  potassium chloride (KLOR-CON 10) 10 MEQ tablet Take 10 mEq by mouth daily. 02/25/15  Yes Historical Provider, MD  carvedilol (COREG) 6.25 MG tablet  Take 6.25 mg by mouth 2 (two) times daily with a meal.      Historical Provider, MD  cloNIDine (CATAPRES) 0.1 MG tablet Take 3 tablets (0.3 mg total) by mouth 2 (two) times daily. 11/19/11   Ok Anis, NP  lisinopril (PRINIVIL,ZESTRIL) 40 MG tablet Take 1 tablet (40 mg total) by mouth daily. 11/06/11   Antonieta Iba, MD  naproxen (NAPROSYN) 500 MG tablet Take 500 mg by mouth 1 dose over 24 hours.      Historical Provider, MD      PHYSICAL EXAMINATION:   VITAL SIGNS: Blood pressure (!) 94/55, pulse 69, temperature 98.9 F (37.2 C), temperature source Oral, resp. rate 15, height 5\' 6"  (1.676 m), weight 90.7 kg (200 lb), SpO2 97 %.  GENERAL:  83 y.o.-year-old patient lying in the bed with no acute distress.  EYES: Pupils equal, round, reactive to light and accommodation. No scleral icterus. Extraocular muscles intact.  HEENT: Head atraumatic, normocephalic. Oropharynx and nasopharynx clear.  NECK:  Supple, no jugular venous distention. No thyroid enlargement, no tenderness.  LUNGS: Decreased breath sounds bilaterally, bibasilar crepitations heard. No use of accessory muscles of respiration.  CARDIOVASCULAR: S1, S2 irregular. No murmurs, rubs, or gallops.  ABDOMEN: Soft, nontender, nondistended. Bowel sounds decreased. No organomegaly or mass.  EXTREMITIES: Has 2 plus pedal edema, no cyanosis, or clubbing.  NEUROLOGIC: Cranial nerves II through XII are intact. Muscle strength 5/5 in all extremities. Sensation intact. Gait not checked.  PSYCHIATRIC: The patient is alert and oriented x 3.  SKIN: No obvious rash, lesion, or ulcer.   LABORATORY PANEL:   CBC  Recent Labs Lab 08/17/16 2141  WBC 11.9*  HGB 11.7*  HCT 35.2*  PLT 152  MCV 90.8  MCH 30.3  MCHC 33.4  RDW 14.1  LYMPHSABS 0.2*  MONOABS 0.6  EOSABS 0.0  BASOSABS 0.1   ------------------------------------------------------------------------------------------------------------------  Chemistries   Recent  Labs Lab 08/17/16 2141  NA 136  K 3.5  CL 102  CO2 26  GLUCOSE 197*  BUN 23*  CREATININE 1.29*  CALCIUM 8.3*  AST 566*  ALT 447*  ALKPHOS 203*  BILITOT 3.1*   ------------------------------------------------------------------------------------------------------------------ estimated creatinine clearance is 41.8 mL/min (by C-G formula based on SCr of 1.29 mg/dL (H)). ------------------------------------------------------------------------------------------------------------------ No results for input(s): TSH, T4TOTAL, T3FREE, THYROIDAB in the last 72 hours.  Invalid input(s): FREET3   Coagulation profile No results for input(s): INR, PROTIME in the last 168 hours. ------------------------------------------------------------------------------------------------------------------- No results for input(s): DDIMER in the last 72 hours. -------------------------------------------------------------------------------------------------------------------  Cardiac Enzymes  Recent Labs Lab 08/17/16 2141  TROPONINI <0.03   ------------------------------------------------------------------------------------------------------------------ Invalid input(s): POCBNP  ---------------------------------------------------------------------------------------------------------------  Urinalysis    Component Value Date/Time   COLORURINE AMBER BIOCHEMICALS MAY BE AFFECTED BY COLOR (A) 11/21/2007 1906   APPEARANCEUR  CLEAR 11/21/2007 1906   LABSPEC 1.025 11/21/2007 1906   PHURINE 6.0 11/21/2007 1906   GLUCOSEU NEGATIVE 11/21/2007 1906   HGBUR NEGATIVE 11/21/2007 1906   BILIRUBINUR SMALL (A) 11/21/2007 1906   KETONESUR NEGATIVE 11/21/2007 1906   PROTEINUR 30 (A) 11/21/2007 1906   UROBILINOGEN 1.0 11/21/2007 1906   NITRITE NEGATIVE 11/21/2007 1906   LEUKOCYTESUR TRACE (A) 11/21/2007 1906     RADIOLOGY: Dg Chest 2 View  Result Date: 08/17/2016 CLINICAL DATA:  Shortness of breath. EXAM:  CHEST  2 VIEW COMPARISON:  10/11/2014. FINDINGS: Prior CABG. Cardiomegaly with normal pulmonary vascularity. Mild pleural-parenchymal thickening consistent with scarring again noted. No change. IMPRESSION: 1.  Prior CABG.  Cardiomegaly with normal pulmonary vascularity. 2. Mild bibasilar pleuroparenchymal thickening noted consistent scarring. No change. Electronically Signed   By: Maisie Fushomas  Register   On: 08/17/2016 16:43   Ct Angio Chest Pe W Or Wo Contrast  Result Date: 08/17/2016 CLINICAL DATA:  Dyspnea, onset yesterday. EXAM: CT ANGIOGRAPHY CHEST WITH CONTRAST TECHNIQUE: Multidetector CT imaging of the chest was performed using the standard protocol during bolus administration of intravenous contrast. Multiplanar CT image reconstructions and MIPs were obtained to evaluate the vascular anatomy. CONTRAST:  75 mL Isovue 370 intravenous COMPARISON:  04/03/2013 FINDINGS: Cardiovascular: Satisfactory opacification of the pulmonary arteries to the segmental level. No evidence of pulmonary embolism. Stable cardiomegaly. Atherosclerotic calcifications of the aorta and coronary arteries. No pericardial effusion. Mediastinum/Nodes: No enlarged mediastinal, hilar, or axillary lymph nodes. Thyroid gland, trachea, and esophagus demonstrate no significant findings. Lungs/Pleura: Chronic loculated effusion in the right posterior base with adjacent pleuroparenchymal scarring. Milder linear scarring in the left base. This is all unchanged from 2014. No new opacities. Airways are patent and normal in caliber. Upper Abdomen: Hiatal hernia. There is gallbladder mural thickening and pericholecystic edema/ fluid, as well as multiple intraluminal gallbladder calculi. The mural thickening and adjacent edema is new, and is suspicious for cholecystitis. Musculoskeletal: No significant skeletal lesion Review of the MIP images confirms the above findings. IMPRESSION: 1. Negative for pulmonary embolism or other acute vascular abnormality.  2. Chronic loculated pleural collection in the right posterior base with adjacent pleuroparenchymal scarring, unchanged. 3. Gallbladder mural thickening and pericholecystic edema. Cholelithiasis. Findings are concerning for acute cholecystitis. Recommend right upper quadrant ultrasound. 4. Hiatal hernia. 5. These results will be called to the ordering clinician or representative by the Radiologist Assistant, and communication documented in the PACS or zVision Dashboard. Electronically Signed   By: Ellery Plunkaniel R Mitchell M.D.   On: 08/17/2016 23:30   [place] Abdomen Limited Ruq  Result Date: 08/18/2016 CLINICAL DATA:  Initial evaluation for elevated LFTs. EXAM: US ABDOMEN LIMITED - RIGHT UPPER QUADRANT COMPARISON:  None available. FINDINGS: Gallbladder: Multiple echogenic stones present within the gallbladder lumen, largest of which measures approximately 8 mm. Gallbladder wall is markedly thickened to 11 mm. Suggestion of edema/fluid within the gallbladder wall as well. No sonographic Murphy sign elicited on exam. Common bile duct: Diameter: 5.6 mm Liver: No focal lesion identified. Within normal limits in parenchymal echogenicity. Evaluation of the left lobe somewhat limited due to shadowing bowel gas. Probable right pleural effusion noted. Incidental note made of a 3.4 x 4.1 x 2.9 cm anechoic right renal cyst. IMPRESSION: 1. Cholelithiasis with marked gallbladder wall thickening up to 11 mm. Clinical correlation for possible acute cholecystitis recommended. No biliary dilatation. 2. Right pleural effusion. 3. 3.4 x 4.1 x 2.9 cm right renal cyst. Electronically Signed   By: Janell QuietBenjamin  McClintock M.D.  On: 08/18/2016 00:21    EKG: Orders placed or performed during the hospital encounter of 08/17/16  . ED EKG  . ED EKG    IMPRESSION AND PLAN: 83 year old male patient with history of heart failure, coronary artery disease, cardiomyopathy, hypertension, hyperlipidemia presented to the emergency room with increased  shortness of breath. Admitting diagnosis 1. Decompensated heart failure 2. Acute cholecystitis 3. Abnormal liver function tests 4. Cardiomyopathy 5. Atrial fibrillation 6. Coronary artery disease Treatment plan Admit patient to telemetry Surgery consultation Cardiology consultation for atrial fibrillation and CHF exacerbation Diurese patient with IV Lasix Keep patient nothing by mouth Follow-up liver function tests DVT prophylaxis with sequential compression devices to lower extremities Hold blood thinner medication Start patient on IV Levaquin antibiotic Supportive care  All the records are reviewed and case discussed with ED provider. Management plans discussed with the patient, family and they are in agreement.  CODE STATUS:FULL CODE Code Status History    This patient does not have a recorded code status. Please follow your organizational policy for patients in this situation.    Advance Directive Documentation   Flowsheet Row Most Recent Value  Type of Advance Directive  Healthcare Power of Attorney, Living will  Pre-existing out of facility DNR order (yellow form or pink MOST form)  No data  "MOST" Form in Place?  No data       TOTAL TIME TAKING CARE OF THIS PATIENT: 57 minutes.    Ihor Austin M.D on 08/18/2016 at 4:02 AM  Between 7am to 6pm - Pager - [phone]  After 6pm go to [email or web address] - password [email or web address] [facility]  [facility]  Office  [phone]  CC: Primary care physician; Dortha Kern, MD

## 2016-08-18 NOTE — ED Provider Notes (Signed)
Time Seen: Approximately1948  I have reviewed the triage notes  Chief Complaint: Shortness of Breath   History of Present Illness: [name]. is a 83 y.o. male who presents with recent onset of shortness of breath. Patient states his shortness of breath seems to be related to forms of physical exertion. He denies any associated chest pain, arm pain or jaw pain. He has had a history of peripheral edema but states normally is left leg is more swollen than the right which recently has been reversed. He is currently not on any aggressive anticoagulation therapy. He denies any fever and to this historian and describes a occasional nonproductive cough. Past Medical History:  Diagnosis Date  . Aortic sclerosis    a. by echo 01/2010 w/ systolic murmur  . CHF (congestive heart failure) (HCC)   . Coronary artery disease    a. 02/2010 s/p CABG x 3 - [place]->LAD, VG->Ramus, VG->D1.  Marland Kitchen Hyperlipidemia   . Ischemic cardiomyopathy   . Labile hypertension   . Obesity   . Systolic CHF, chronic (HCC)    a. 01/2010 Echo - EF 35%    Patient Active Problem List   Diagnosis Date Noted  . Ischemic cardiomyopathy   . Systolic CHF, chronic (HCC)   . Aortic sclerosis   . HYPERTENSION, BENIGN 05/23/2010  . CORONARY ARTERY BYPASS GRAFT, THREE VESSEL, HX OF 03/09/2010  . HYPERLIPIDEMIA-MIXED 02/09/2010  . EDEMA 02/02/2010  . SHORTNESS OF BREATH 02/02/2010    Past Surgical History:  Procedure Laterality Date  . BACK SURGERY     x 2  . CORONARY ARTERY BYPASS GRAFT  02/14/2010   x 3  . FRACTURE SURGERY    . KNEE SURGERY     bilateral knee surgery  . SHOULDER SURGERY     right    Past Surgical History:  Procedure Laterality Date  . BACK SURGERY     x 2  . CORONARY ARTERY BYPASS GRAFT  02/14/2010   x 3  . FRACTURE SURGERY    . KNEE SURGERY     bilateral knee surgery  . SHOULDER SURGERY     right    Current Outpatient Rx  . Order #: [id number] Class: Historical Med  . Order #:  [id number] Class: Historical Med  . Order #: [id number] Class: Normal  . Order #: [id number] Class: Historical Med  . Order #: [id number] Class: Historical Med  . Order #: [id number] Class: Historical Med  . Order #: [id number] Class: Historical Med  . Order #: [id number] Class: Historical Med  . Order #: [id number] Class: Historical Med  . Order #: [id number] Class: Historical Med  . Order #: [id number] Class: Historical Med    Allergies:  Patient has no active allergies.  Family History: Family History  Problem Relation Age of Onset  . Cancer Mother     died @ 8  . Stroke Father     died @ 38 or pna    Social History: Social History  Substance Use Topics  . Smoking status: Never Smoker  . Smokeless tobacco: Never Used  . Alcohol use Yes     Comment: "very seldom" alcoholic beverage     Review of Systems:   10 point review of systems was performed and was otherwise negative:  Constitutional: No fever Eyes: No visual disturbances ENT: No sore throat, ear pain Cardiac: No chest pain Respiratory: Shortness of breath with exertion Abdomen: No abdominal pain, no vomiting, No diarrhea Endocrine: No weight loss, No night sweats Extremities: Edema described above Skin: No rashes,  easy bruising Neurologic: No focal weakness, trouble with speech or swollowing Urologic: No dysuria, Hematuria, or urinary frequency   Physical Exam:  ED Triage Vitals  Enc Vitals Group     BP 08/17/16 1559 (!) 168/64     Pulse Rate 08/17/16 1559 91     Resp 08/17/16 1559 20     Temp 08/17/16 1559 98.9 F (37.2 C)     Temp Source 08/17/16 1559 Oral     SpO2 08/17/16 1559 95 %     Weight 08/17/16 1601 200 lb (90.7 kg)     Height 08/17/16 1601 5\' 6"  (1.676 m)     Head Circumference --      Peak Flow --      Pain Score --      Pain Loc --      Pain Edu? --      Excl. in GC? --     General: Awake , Alert , and Oriented times 3; GCS 15 Head: Normal cephalic , atraumatic Eyes: Pupils equal , round, reactive  to light Nose/Throat: No nasal drainage, patent upper airway without erythema or exudate.  Neck: Supple, Full range of motion, No anterior adenopathy or palpable thyroid masses Lungs: Clear to ascultation without wheezes , rhonchi, or rales Heart: Regular rate, regular rhythm without murmurs , gallops , or rubs Abdomen: Soft, non tender without rebound, guarding , or rigidity; bowel sounds positive and symmetric in all 4 quadrants. No organomegaly .        Extremities: Patient has swelling in both lower extremities right side worse than the left. Mild pitting component. Neurologic: normal ambulation, Motor symmetric without deficits, sensory intact Skin: warm, dry, no rashes   Labs:   All laboratory work was reviewed including any pertinent negatives or positives listed below:  Labs Reviewed  CBC WITH DIFFERENTIAL/PLATELET - Abnormal; Notable for the following:       Result Value   WBC 11.9 (*)    RBC 3.87 (*)    Hemoglobin 11.7 (*)    HCT 35.2 (*)    Neutro Abs 11.0 (*)    Lymphs Abs 0.2 (*)    All other components within normal limits  COMPREHENSIVE METABOLIC PANEL - Abnormal; Notable for the following:    Glucose, Bld 197 (*)    BUN 23 (*)    Creatinine, Ser 1.29 (*)    Calcium 8.3 (*)    Albumin 3.4 (*)    AST 566 (*)    ALT 447 (*)    Alkaline Phosphatase 203 (*)    Total Bilirubin 3.1 (*)    GFR calc non Af Amer 48 (*)    GFR calc Af Amer 55 (*)    All other components within normal limits  BRAIN NATRIURETIC PEPTIDE - Abnormal; Notable for the following:    B Natriuretic Peptide 229.0 (*)    All other components within normal limits  FIBRIN DERIVATIVES D-DIMER ([facility] ONLY) - Abnormal; Notable for the following:    Fibrin derivatives D-dimer ([facility]) 5,008 (*)    All other components within normal limits  TROPONIN I  Patient has some unexpected laboratory results showing elevated liver function enzymes along with a significantly elevated D-dimer test.  EKG:  ED ECG  REPORT I, Jennye MoccasinBrian S Loryn Haacke, the attending physician, personally viewed and interpreted this ECG.  Date: 08/18/2016 EKG Time: 1612 Rate: 86 Rhythm: Difficult to determine rhythm appears to be atrial fibrillation QRS Axis: normal Intervals: Right bundle-branch block pattern ST/T Wave abnormalities:  normal Conduction Disturbances: none Narrative Interpretation: unremarkable   Radiology:  "Dg Chest 2 View  Result Date: 08/17/2016 CLINICAL DATA:  Shortness of breath. EXAM: CHEST  2 VIEW COMPARISON:  10/11/2014. FINDINGS: Prior CABG. Cardiomegaly with normal pulmonary vascularity. Mild pleural-parenchymal thickening consistent with scarring again noted. No change. IMPRESSION: 1.  Prior CABG.  Cardiomegaly with normal pulmonary vascularity. 2. Mild bibasilar pleuroparenchymal thickening noted consistent scarring. No change. Electronically Signed   By: Maisie Fus  Register   On: 08/17/2016 16:43   Ct Angio Chest Pe W Or Wo Contrast  Result Date: 08/17/2016 CLINICAL DATA:  Dyspnea, onset yesterday. EXAM: CT ANGIOGRAPHY CHEST WITH CONTRAST TECHNIQUE: Multidetector CT imaging of the chest was performed using the standard protocol during bolus administration of intravenous contrast. Multiplanar CT image reconstructions and MIPs were obtained to evaluate the vascular anatomy. CONTRAST:  75 mL Isovue 370 intravenous COMPARISON:  04/03/2013 FINDINGS: Cardiovascular: Satisfactory opacification of the pulmonary arteries to the segmental level. No evidence of pulmonary embolism. Stable cardiomegaly. Atherosclerotic calcifications of the aorta and coronary arteries. No pericardial effusion. Mediastinum/Nodes: No enlarged mediastinal, hilar, or axillary lymph nodes. Thyroid gland, trachea, and esophagus demonstrate no significant findings. Lungs/Pleura: Chronic loculated effusion in the right posterior base with adjacent pleuroparenchymal scarring. Milder linear scarring in the left base. This is all unchanged from 2014.  No new opacities. Airways are patent and normal in caliber. Upper Abdomen: Hiatal hernia. There is gallbladder mural thickening and pericholecystic edema/ fluid, as well as multiple intraluminal gallbladder calculi. The mural thickening and adjacent edema is new, and is suspicious for cholecystitis. Musculoskeletal: No significant skeletal lesion Review of the MIP images confirms the above findings. IMPRESSION: 1. Negative for pulmonary embolism or other acute vascular abnormality. 2. Chronic loculated pleural collection in the right posterior base with adjacent pleuroparenchymal scarring, unchanged. 3. Gallbladder mural thickening and pericholecystic edema. Cholelithiasis. Findings are concerning for acute cholecystitis. Recommend right upper quadrant ultrasound. 4. Hiatal hernia. 5. These results will be called to the ordering clinician or representative by the Radiologist Assistant, and communication documented in the PACS or zVision Dashboard. Electronically Signed   By: Ellery Plunk M.D.   On: 08/17/2016 23:30  " I personally reviewed the radiologic studies    ED Course: * Patients awaiting currently right upper quadrant ultrasound to evaluate findings on chest CT for possible acute cholecystitis. The patient exhibits no symptoms of abdominal pain and no significant elevation of his liver transaminases along with his total bilirubin. Chest CT was performed mainly to rule out pulmonary embolism which doesn't appear evident at this time. Clinical Course      Assessment: * Dyspnea on exertion Possible new onset atrial fibrillation   Final Clinical Impression: *  Final diagnoses:  Shortness of breath  Elevated liver enzymes     Plan:  Follow up on ultrasound results            Jennye Moccasin, MD 08/18/16 0028

## 2016-08-18 NOTE — Consult Note (Signed)
Date of Consultation:  08/18/2016  Requesting Physician:  Saundra Shelling, MD  Reason for Consultation:  Cholelithiasis with pericholecystic fluid  History of Present Illness: [name]. is a 83 y.o. male admitted overnight with worsening shortness of breath.  The patient reports he was walking a short distance and became very short of breath and took him about 30 minutes to recover.  He was very concerned as he had similar symptoms before his CABG in 2011 and came in for evaluation.  He was found to be in atrial fibrillation.  CT of the chest was performed which was negative for PE but found cholelithiasis with pericholecystic fluid, which was also seen on subsequent ultrasound.  Also, laboratory work up revealed mild leukocytosis to 11.9 and elevated LFTs with total bilirubin of 3.1, AST 566, ALT 447, and alk phos 203.  He had normal LFTs in 2015 during a hospitalization for syncope at [facility].  The patient reports that he had some mild pain over the right upper quadrant yesterday morning after having breakfast, but it was only one episode and has not had any pain in the past.  He also denies any nausea, vomiting, or fevers.  Overall the shortness of breath is the main symptom that brought him in.  Past Medical History: Past Medical History:  Diagnosis Date  . Aortic sclerosis    a. by echo 02/3418 w/ systolic murmur  . CHF (congestive heart failure) ([place])   . Coronary artery disease    a. 02/2010 s/p CABG x 3 - [place]->LAD, VG->[place], VG->D1.  Marland Kitchen Hyperlipidemia   . Ischemic cardiomyopathy   . Labile hypertension   . Obesity   . Systolic CHF, chronic ([place])    a. 01/2010 Echo - EF 35%     Past Surgical History: Past Surgical History:  Procedure Laterality Date  . BACK SURGERY     x 2  . CORONARY ARTERY BYPASS GRAFT  02/14/2010   x 3  . FRACTURE SURGERY    . KNEE SURGERY     bilateral knee surgery  . SHOULDER SURGERY     right    Home Medications: Prior to Admission  medications   Medication Sig Start Date End Date Taking? Authorizing Provider  albuterol (PROVENTIL) (2.5 MG/3ML) 0.083% nebulizer solution Inhale 2.5 mLs into the lungs every 6 (six) hours as needed.   Yes Historical Provider, MD  aspirin 81 MG tablet Take 81 mg by mouth daily.     Yes Historical Provider, MD  atorvastatin (LIPITOR) 80 MG tablet Take 1 tablet (80 mg total) by mouth daily. 02/28/11  Yes Minna Merritts, MD  furosemide (LASIX) 40 MG tablet Take 40 mg by mouth daily.     Yes Historical Provider, MD  magnesium oxide (MAG-OX) 400 MG tablet Take 400 mg by mouth daily.   Yes Historical Provider, MD  omeprazole (PRILOSEC) 20 MG capsule Take 20 mg by mouth daily.     Yes Historical Provider, MD  potassium chloride (KLOR-CON 10) 10 MEQ tablet Take 10 mEq by mouth daily. 02/25/15  Yes Historical Provider, MD  carvedilol (COREG) 6.25 MG tablet Take 6.25 mg by mouth 2 (two) times daily with a meal.      Historical Provider, MD  cloNIDine (CATAPRES) 0.1 MG tablet Take 3 tablets (0.3 mg total) by mouth 2 (two) times daily. 11/19/11   Rogelia Mire, NP  lisinopril (PRINIVIL,ZESTRIL) 40 MG tablet Take 1 tablet (40 mg total) by mouth daily. 11/06/11   Christia Reading  Gloriajean Dell, MD  naproxen (NAPROSYN) 500 MG tablet Take 500 mg by mouth 1 dose over 24 hours.      Historical Provider, MD    Allergies: No Active Allergies  Social History:  reports that he has never smoked. He has never used smokeless tobacco. He reports that he drinks alcohol. He reports that he does not use drugs.   Family History: Family History  Problem Relation Age of Onset  . Cancer Mother     died @ 81  . Stroke Father     died @ 88 or pna    Review of Systems: Review of Systems  Constitutional: Negative for chills and fever.  HENT: Negative for hearing loss.   Eyes: Negative for blurred vision.  Respiratory: Positive for shortness of breath. Negative for cough.   Cardiovascular: Positive for leg swelling. Negative  for chest pain.  Gastrointestinal: Positive for abdominal pain (transient in RUQ). Negative for blood in stool, constipation, diarrhea, heartburn, nausea and vomiting.  Genitourinary: Negative for dysuria.  Musculoskeletal: Negative for myalgias.  Skin: Negative for rash.  Neurological: Negative for dizziness.  Psychiatric/Behavioral: Negative for depression.  All other systems reviewed and are negative.   Physical Exam BP (!) 178/85 (BP Location: Left Arm)   Pulse 77   Temp 98.9 F (37.2 C) (Oral)   Resp 16   Ht 5' 10"  (1.778 m)   Wt 92.7 kg (204 lb 4.8 oz)   SpO2 97%   BMI 29.31 kg/m  CONSTITUTIONAL: No acute distress HEENT:  Normocephalic, atraumatic, extraocular motion intact. NECK: Trachea is midline, and there is no jugular venous distension. Thyroid is without palpable abnormalities. LYMPH NODES:  Lymph nodes in the neck are not enlarged. RESPIRATORY:  Lungs are clear bilaterally with some decreased sound at bases. No respiratory distress, wearing Patrick at 2L CARDIOVASCULAR:  Irregular rhythm, regular rate GI: The abdomen is soft, non-distended, non-tender to palpation.  Negative Murphy's sign. MUSCULOSKELETAL:  Normal muscle strength and tone in all four extremities.  Lower extremity edema. SKIN: Skin turgor is normal. There are no pathologic skin lesions.  NEUROLOGIC:  Motor and sensation is grossly normal.  Cranial nerves are grossly intact. PSYCH:  Alert and oriented to person, place and time. Affect is normal.  Laboratory Analysis: Results for orders placed or performed during the hospital encounter of 08/17/16 (from the past 24 hour(s))  CBC with Differential/Platelet     Status: Abnormal   Collection Time: 08/17/16  9:41 PM  Result Value Ref Range   WBC 11.9 (H) 3.8 - 10.6 K/uL   RBC 3.87 (L) 4.40 - 5.90 MIL/uL   Hemoglobin 11.7 (L) 13.0 - 18.0 g/dL   HCT 35.2 (L) 40.0 - 52.0 %   MCV 90.8 80.0 - 100.0 fL   MCH 30.3 26.0 - 34.0 pg   MCHC 33.4 32.0 - 36.0 g/dL    RDW 14.1 11.5 - 14.5 %   Platelets 152 150 - 440 K/uL   Neutrophils Relative % 93 %   Neutro Abs 11.0 (H) 1.4 - 6.5 K/uL   Lymphocytes Relative 1 %   Lymphs Abs 0.2 (L) 1.0 - 3.6 K/uL   Monocytes Relative 5 %   Monocytes Absolute 0.6 0.2 - 1.0 K/uL   Eosinophils Relative 0 %   Eosinophils Absolute 0.0 0 - 0.7 K/uL   Basophils Relative 1 %   Basophils Absolute 0.1 0 - 0.1 K/uL  Comprehensive metabolic panel     Status: Abnormal   Collection Time: 08/17/16  9:41 PM  Result Value Ref Range   Sodium 136 135 - 145 mmol/L   Potassium 3.5 3.5 - 5.1 mmol/L   Chloride 102 101 - 111 mmol/L   CO2 26 22 - 32 mmol/L   Glucose, Bld 197 (H) 65 - 99 mg/dL   BUN 23 (H) 6 - 20 mg/dL   Creatinine, Ser 1.29 (H) 0.61 - 1.24 mg/dL   Calcium 8.3 (L) 8.9 - 10.3 mg/dL   Total Protein 6.7 6.5 - 8.1 g/dL   Albumin 3.4 (L) 3.5 - 5.0 g/dL   AST 566 (H) 15 - 41 U/L   ALT 447 (H) 17 - 63 U/L   Alkaline Phosphatase 203 (H) 38 - 126 U/L   Total Bilirubin 3.1 (H) 0.3 - 1.2 mg/dL   GFR calc non Af Amer 48 (L) >60 mL/min   GFR calc Af Amer 55 (L) >60 mL/min   Anion gap 8 5 - 15  Brain natriuretic peptide     Status: Abnormal   Collection Time: 08/17/16  9:41 PM  Result Value Ref Range   B Natriuretic Peptide 229.0 (H) 0.0 - 100.0 pg/mL  Troponin I     Status: None   Collection Time: 08/17/16  9:41 PM  Result Value Ref Range   Troponin I <0.03 <0.03 ng/mL  Fibrin derivatives D-Dimer ([facility] only)     Status: Abnormal   Collection Time: 08/17/16  9:41 PM  Result Value Ref Range   Fibrin derivatives D-dimer ([facility]) 5,008 (H) 0 - 499  Comprehensive metabolic panel     Status: Abnormal   Collection Time: 08/18/16  5:23 AM  Result Value Ref Range   Sodium 138 135 - 145 mmol/L   Potassium 3.6 3.5 - 5.1 mmol/L   Chloride 105 101 - 111 mmol/L   CO2 27 22 - 32 mmol/L   Glucose, Bld 101 (H) 65 - 99 mg/dL   BUN 22 (H) 6 - 20 mg/dL   Creatinine, Ser 1.09 0.61 - 1.24 mg/dL   Calcium 8.7 (L) 8.9 - 10.3 mg/dL    Total Protein 6.7 6.5 - 8.1 g/dL   Albumin 3.4 (L) 3.5 - 5.0 g/dL   AST 374 (H) 15 - 41 U/L   ALT 401 (H) 17 - 63 U/L   Alkaline Phosphatase 207 (H) 38 - 126 U/L   Total Bilirubin 3.5 (H) 0.3 - 1.2 mg/dL   GFR calc non Af Amer 59 (L) >60 mL/min   GFR calc Af Amer >60 >60 mL/min   Anion gap 6 5 - 15  CBC     Status: Abnormal   Collection Time: 08/18/16  5:23 AM  Result Value Ref Range   WBC 9.3 3.8 - 10.6 K/uL   RBC 4.03 (L) 4.40 - 5.90 MIL/uL   Hemoglobin 12.3 (L) 13.0 - 18.0 g/dL   HCT 36.2 (L) 40.0 - 52.0 %   MCV 89.7 80.0 - 100.0 fL   MCH 30.6 26.0 - 34.0 pg   MCHC 34.1 32.0 - 36.0 g/dL   RDW 14.2 11.5 - 14.5 %   Platelets 162 150 - 440 K/uL  Troponin I     Status: None   Collection Time: 08/18/16  5:23 AM  Result Value Ref Range   Troponin I <0.03 <0.03 ng/mL    Imaging: Dg Chest 2 View  Result Date: 08/17/2016 CLINICAL DATA:  Shortness of breath. EXAM: CHEST  2 VIEW COMPARISON:  10/11/2014. FINDINGS: Prior CABG. Cardiomegaly with normal pulmonary vascularity. Mild pleural-parenchymal thickening  consistent with scarring again noted. No change. IMPRESSION: 1.  Prior CABG.  Cardiomegaly with normal pulmonary vascularity. 2. Mild bibasilar pleuroparenchymal thickening noted consistent scarring. No change. Electronically Signed   By: Marcello Moores  Register   On: 08/17/2016 16:43   Ct Angio Chest Pe W Or Wo Contrast  Result Date: 08/17/2016 CLINICAL DATA:  Dyspnea, onset yesterday. EXAM: CT ANGIOGRAPHY CHEST WITH CONTRAST TECHNIQUE: Multidetector CT imaging of the chest was performed using the standard protocol during bolus administration of intravenous contrast. Multiplanar CT image reconstructions and MIPs were obtained to evaluate the vascular anatomy. CONTRAST:  75 mL Isovue 370 intravenous COMPARISON:  04/03/2013 FINDINGS: Cardiovascular: Satisfactory opacification of the pulmonary arteries to the segmental level. No evidence of pulmonary embolism. Stable cardiomegaly. Atherosclerotic  calcifications of the aorta and coronary arteries. No pericardial effusion. Mediastinum/Nodes: No enlarged mediastinal, hilar, or axillary lymph nodes. Thyroid gland, trachea, and esophagus demonstrate no significant findings. Lungs/Pleura: Chronic loculated effusion in the right posterior base with adjacent pleuroparenchymal scarring. Milder linear scarring in the left base. This is all unchanged from 2014. No new opacities. Airways are patent and normal in caliber. Upper Abdomen: Hiatal hernia. There is gallbladder mural thickening and pericholecystic edema/ fluid, as well as multiple intraluminal gallbladder calculi. The mural thickening and adjacent edema is new, and is suspicious for cholecystitis. Musculoskeletal: No significant skeletal lesion Review of the MIP images confirms the above findings. IMPRESSION: 1. Negative for pulmonary embolism or other acute vascular abnormality. 2. Chronic loculated pleural collection in the right posterior base with adjacent pleuroparenchymal scarring, unchanged. 3. Gallbladder mural thickening and pericholecystic edema. Cholelithiasis. Findings are concerning for acute cholecystitis. Recommend right upper quadrant ultrasound. 4. Hiatal hernia. 5. These results will be called to the ordering clinician or representative by the Radiologist Assistant, and communication documented in the PACS or zVision Dashboard. Electronically Signed   By: Andreas Newport M.D.   On: 08/17/2016 23:30   US Abdomen Limited Ruq  Result Date: 08/18/2016 CLINICAL DATA:  Initial evaluation for elevated LFTs. EXAM: US ABDOMEN LIMITED - RIGHT UPPER QUADRANT COMPARISON:  None available. FINDINGS: Gallbladder: Multiple echogenic stones present within the gallbladder lumen, largest of which measures approximately 8 mm. Gallbladder wall is markedly thickened to 11 mm. Suggestion of edema/fluid within the gallbladder wall as well. No sonographic Murphy sign elicited on exam. Common bile duct: Diameter:  5.6 mm Liver: No focal lesion identified. Within normal limits in parenchymal echogenicity. Evaluation of the left lobe somewhat limited due to shadowing bowel gas. Probable right pleural effusion noted. Incidental note made of a 3.4 x 4.1 x 2.9 cm anechoic right renal cyst. IMPRESSION: 1. Cholelithiasis with marked gallbladder wall thickening up to 11 mm. Clinical correlation for possible acute cholecystitis recommended. No biliary dilatation. 2. Right pleural effusion. 3. 3.4 x 4.1 x 2.9 cm right renal cyst. Electronically Signed   By: Jeannine Boga M.D.   On: 08/18/2016 00:21    Assessment and Plan: This is a 83 y.o. male who presents with shortness of breath and atrial fibrillation, with chest CT finding of gallbladder wall thickening with pericholecystic fluid as well as elevated LFTs.  --Given elevated LFTs persistent this morning, have placed order for GI consult for further evaluation in case patient may need MRCP vs ERCP.  If no ERCP capabilities available this weekend, patient may need to be transferred to a tertiary care center. --Currently there is no abdominal pain.  The patient may have had an episode of biliary colic vs mild cholecystitis.  Would recommend continuing antibiotics and treat as possible cholecystitis for 10 day total course.  Given his cardiac comorbidities with CHF and now hospitalized with worsening shortness of breath and atrial fibrillation, he is not a good surgical candidate for cholecystectomy.  If his abdominal pain were to return/worsen, he may need a percutaneous cholecystostomy tube.  For now, he's improved and may be fine with antibiotics alone.  Patient understands this plan and all of his questions have been answered. --For now, keep NPO until GI sees the patient.  He would need to be NPO for MRCP and/or ERCP.  If no studies or procedures today, may start on clears and advance as tolerated. --Will follow along with you.   Melvyn Neth, [place]

## 2016-08-18 NOTE — ED Notes (Signed)
Patient reporting lower back pain; normally sleeps in a recliner. Patient repositioned in bed. [facility]  [facility]. Order to be carried by this RN.

## 2016-08-19 LAB — HEPATIC FUNCTION PANEL
ALK PHOS: 222 U/L — AB (ref 38–126)
ALT: 253 U/L — AB (ref 17–63)
AST: 156 U/L — ABNORMAL HIGH (ref 15–41)
Albumin: 3.3 g/dL — ABNORMAL LOW (ref 3.5–5.0)
BILIRUBIN DIRECT: 1 mg/dL — AB (ref 0.1–0.5)
BILIRUBIN INDIRECT: 1 mg/dL — AB (ref 0.3–0.9)
BILIRUBIN TOTAL: 2 mg/dL — AB (ref 0.3–1.2)
Total Protein: 6.8 g/dL (ref 6.5–8.1)

## 2016-08-19 MED ORDER — LISINOPRIL 5 MG PO TABS
5.0000 mg | ORAL_TABLET | Freq: Every day | ORAL | Status: DC
Start: 2016-08-19 — End: 2016-08-20
  Administered 2016-08-19 – 2016-08-20 (×2): 5 mg via ORAL
  Filled 2016-08-19 (×2): qty 1

## 2016-08-19 MED ORDER — ALBUTEROL SULFATE (2.5 MG/3ML) 0.083% IN NEBU
2.5000 mL | INHALATION_SOLUTION | Freq: Four times a day (QID) | RESPIRATORY_TRACT | Status: DC
  Administered 2016-08-19 (×2): 2.5 mL via RESPIRATORY_TRACT
  Administered 2016-08-20 (×2): 3 mL via RESPIRATORY_TRACT
  Filled 2016-08-19 (×4): qty 3

## 2016-08-19 MED ORDER — MAGNESIUM OXIDE 400 (241.3 MG) MG PO TABS: 400.0000 mg | ORAL_TABLET | Freq: Every evening | ORAL | Status: DC

## 2016-08-19 MED ORDER — LEVOFLOXACIN 500 MG PO TABS
500.0000 mg | ORAL_TABLET | Freq: Every day | ORAL | Status: DC
  Administered 2016-08-20: 500 mg via ORAL
  Filled 2016-08-19: qty 1

## 2016-08-19 NOTE — Progress Notes (Signed)
Pt. Has converted between A-fib and NSR throughout the night. Pt. Has been alert and oriented with no signs or c/o pain, SOB or acute distress observed.

## 2016-08-19 NOTE — Progress Notes (Signed)
[facility] [place] at [facility]   PATIENT NAME: [name]    MR#:  [id number]  DATE OF BIRTH:  1926-05-01  SUBJECTIVE:  CHIEF COMPLAINT:   Chief Complaint  Patient presents with  . Shortness of Breath   The patient is 83 year old male with a history significant for history of CHF, coronary artery disease, coronary artery bypass grafting, hyperlipidemia, cardiomyopathy, hypertension, who presented to the hospital with complaints of shortness of breath for the last few days, back pain, but no chest pain. He also admitted of some cough, dry him a right upper quadrant abdominal discomfort, which patient had yesterday in the morning.. On arrival to the hospital patient was noted to have elevated alkaline phosphatase, total bilirubin, liver function tests, AST,  ALT 400/500. Studies revealed thickened gallbladder wall, gallstones, but no choledocholithiasis. Patient is initiated on antibiotic therapy due to concerns of cholecystitis. He has no abdominal pain at present. He was also seen by cardiologist due to mild elevation of troponin, and felt that he had demand ischemia, but not acute coronary syndrome. The patient feels good today, denies any significant shortness of breath, feels that he is improving. Physical therapist recommended home health services  Review of Systems  Constitutional: Negative for chills, fever and weight loss.  HENT: Negative for congestion.   Eyes: Negative for blurred vision and double vision.  Respiratory: Positive for cough, sputum production, shortness of breath and wheezing.   Cardiovascular: Negative for chest pain, palpitations, orthopnea, leg swelling and PND.  Gastrointestinal: Negative for abdominal pain, blood in stool, constipation, diarrhea, nausea and vomiting.  Genitourinary: Negative for dysuria, frequency, hematuria and urgency.  Musculoskeletal: Negative for falls.  Neurological: Negative for dizziness, tremors, focal  weakness and headaches.  Endo/Heme/Allergies: Does not bruise/bleed easily.  Psychiatric/Behavioral: Negative for depression. The patient does not have insomnia.     VITAL SIGNS: Blood pressure (!) 176/89, pulse 71, temperature 97.7 F (36.5 C), temperature source Oral, resp. rate 20, height 5\' 10"  (1.778 m), weight 92.7 kg (204 lb 4.8 oz), SpO2 95 %.  PHYSICAL EXAMINATION:   GENERAL:  83 y.o.-year-old patient lying in the bed with no acute distress.  EYES: Pupils equal, round, reactive to light and accommodation. No scleral icterus. Extraocular muscles intact.  HEENT: Head atraumatic, normocephalic. Oropharynx and nasopharynx clear.  NECK:  Supple, no jugular venous distention. No thyroid enlargement, no tenderness.  LUNGS: Markedly improved air entrance, breath sounds bilaterally, few scattered wheezing,  Crepitations noted at bases bilaterally. Intermittent use of accessory muscles of respiration, especially with movements and speech.  CARDIOVASCULAR: S1, S2 normal. No murmurs, rubs, or gallops.  ABDOMEN: Soft, nontender, nondistended. Bowel sounds present. No organomegaly or mass.  EXTREMITIES: Trace to 1+ lower extremity and pedal edema bilaterally, no cyanosis, or clubbing.  NEUROLOGIC: Cranial nerves II through XII are intact. Muscle strength 5/5 in all extremities. Sensation intact. Gait not checked.  PSYCHIATRIC: The patient is alert and oriented x 3.  SKIN: No obvious rash, lesion, or ulcer.   ORDERS/RESULTS REVIEWED:   CBC  Recent Labs Lab 08/17/16 2141 08/18/16 0523  WBC 11.9* 9.3  HGB 11.7* 12.3*  HCT 35.2* 36.2*  PLT 152 162  MCV 90.8 89.7  MCH 30.3 30.6  MCHC 33.4 34.1  RDW 14.1 14.2  LYMPHSABS 0.2*  --   MONOABS 0.6  --   EOSABS 0.0  --   BASOSABS 0.1  --    ------------------------------------------------------------------------------------------------------------------  Chemistries   Recent Labs Lab 08/17/16 2141 08/18/16  [id number] 08/18/16 1731  08/19/16 0529  NA 136 138 135  --   K 3.5 3.6 3.5  --   CL 102 105 99*  --   CO2 26 27 28   --   GLUCOSE 197* 101* 177*  --   BUN 23* 22* 22*  --   CREATININE 1.29* 1.09 1.05  --   CALCIUM 8.3* 8.7* 8.4*  --   AST 566* 374* 212* 156*  ALT 447* 401* 279* 253*  ALKPHOS 203* 207* 200* 222*  BILITOT 3.1* 3.5* 3.0* 2.0*   ------------------------------------------------------------------------------------------------------------------ estimated creatinine clearance is 55.6 mL/min (by C-G formula based on SCr of 1.05 mg/dL). ------------------------------------------------------------------------------------------------------------------ No results for input(s): TSH, T4TOTAL, T3FREE, THYROIDAB in the last 72 hours.  Invalid input(s): FREET3  Cardiac Enzymes  Recent Labs Lab 08/18/16 0523 08/18/16 1111 08/18/16 1731  TROPONINI <0.03 0.03* 0.03*   ------------------------------------------------------------------------------------------------------------------ Invalid input(s): POCBNP ---------------------------------------------------------------------------------------------------------------  RADIOLOGY: Dg Chest 2 View  Result Date: 08/17/2016 CLINICAL DATA:  Shortness of breath. EXAM: CHEST  2 VIEW COMPARISON:  10/11/2014. FINDINGS: Prior CABG. Cardiomegaly with normal pulmonary vascularity. Mild pleural-parenchymal thickening consistent with scarring again noted. No change. IMPRESSION: 1.  Prior CABG.  Cardiomegaly with normal pulmonary vascularity. 2. Mild bibasilar pleuroparenchymal thickening noted consistent scarring. No change. Electronically Signed   By: Maisie Fushomas  Register   On: 08/17/2016 16:43   Ct Angio Chest Pe W Or Wo Contrast  Result Date: 08/17/2016 CLINICAL DATA:  Dyspnea, onset yesterday. EXAM: CT ANGIOGRAPHY CHEST WITH CONTRAST TECHNIQUE: Multidetector CT imaging of the chest was performed using the standard protocol during bolus administration of intravenous  contrast. Multiplanar CT image reconstructions and MIPs were obtained to evaluate the vascular anatomy. CONTRAST:  75 mL Isovue 370 intravenous COMPARISON:  04/03/2013 FINDINGS: Cardiovascular: Satisfactory opacification of the pulmonary arteries to the segmental level. No evidence of pulmonary embolism. Stable cardiomegaly. Atherosclerotic calcifications of the aorta and coronary arteries. No pericardial effusion. Mediastinum/Nodes: No enlarged mediastinal, hilar, or axillary lymph nodes. Thyroid gland, trachea, and esophagus demonstrate no significant findings. Lungs/Pleura: Chronic loculated effusion in the right posterior base with adjacent pleuroparenchymal scarring. Milder linear scarring in the left base. This is all unchanged from 2014. No new opacities. Airways are patent and normal in caliber. Upper Abdomen: Hiatal hernia. There is gallbladder mural thickening and pericholecystic edema/ fluid, as well as multiple intraluminal gallbladder calculi. The mural thickening and adjacent edema is new, and is suspicious for cholecystitis. Musculoskeletal: No significant skeletal lesion Review of the MIP images confirms the above findings. IMPRESSION: 1. Negative for pulmonary embolism or other acute vascular abnormality. 2. Chronic loculated pleural collection in the right posterior base with adjacent pleuroparenchymal scarring, unchanged. 3. Gallbladder mural thickening and pericholecystic edema. Cholelithiasis. Findings are concerning for acute cholecystitis. Recommend right upper quadrant ultrasound. 4. Hiatal hernia. 5. These results will be called to the ordering clinician or representative by the Radiologist Assistant, and communication documented in the PACS or zVision Dashboard. Electronically Signed   By: Ellery Plunkaniel R Mitchell M.D.   On: 08/17/2016 23:30   Mr Abdomen Mrcp Wo Contrast  Result Date: 08/18/2016 CLINICAL DATA:  83 year old male with elevated liver function tests. Cholelithiasis and  pericholecystic fluid noted on recent CT examination. EXAM: MRI ABDOMEN WITHOUT CONTRAST  (INCLUDING MRCP) TECHNIQUE: Multiplanar multisequence MR imaging of the abdomen was performed. Heavily T2-weighted images of the biliary and pancreatic ducts were obtained, and three-dimensional MRCP images were rendered by post processing. COMPARISON:  No prior abdominal MRI. Chest CT 08/17/2016. Abdominal ultrasound 08/17/2016. FINDINGS:  Comment: Lack of IV gadolinium limits assessment for and characterization of visceral and/or vascular lesions. Lower chest: Small mixed signal intensity fluid collection in the lower right hemithorax, compatible with a complex right-sided pleural effusion which may have some proteinaceous/hemorrhagic contents and appears likely partially loculated. There is some increased signal intensity in the adjacent lung parenchymal, presumably atelectasis. Signal void in the sternum related to median sternotomy wires. Hepatobiliary: No definite cystic or solid hepatic lesions are identified on today's noncontrast examination. No intra or extrahepatic biliary ductal dilatation on MRCP images. Common bile duct measures 5 mm in the porta hepatis. Several small filling defects lie dependently in the gallbladder, compatible with gallstones. Gallbladder wall appears edematous measuring up to 12 mm in thickness with a trace amount of pericholecystic fluid. Gallbladder is only moderately distended. Pancreas: No pancreatic mass or peripancreatic inflammatory changes are noted on today's noncontrast CT examination. No pancreatic ductal dilatation noted on MRCP images. Spleen:  Unremarkable. Adrenals/Urinary Tract: Multiple T1 hypointense, T2 hyperintense lesions noted in the kidneys bilaterally, incompletely characterized on today's noncontrast examination, but likely to represent cysts, measuring up to 4.8 cm in the upper pole of the right kidney. No hydroureteronephrosis in the visualized portions of the  abdomen. Bilateral adrenal glands are normal in appearance. Stomach/Bowel: Visualized portions are unremarkable. Vascular/Lymphatic: No aneurysm identified in the visualized abdominal vasculature. No lymphadenopathy noted in the abdomen. Other: No significant volume of ascites noted in the visualized portions of the peritoneal cavity. Musculoskeletal: No aggressive osseous lesions are noted in the visualized portions of the skeleton. IMPRESSION: 1. Cholelithiasis with gallbladder wall edema and trace amount of pericholecystic fluid. However, gallbladder is only moderately distended. In the appropriate clinical setting, these findings could be seen in the setting of acute cholecystitis, however, these findings may alternatively simply reflect gallbladder wall edema secondary to biliary dysfunction. Clinical correlation is recommended. 2. Small complex partially loculated right-sided pleural effusion with some associated passive subsegmental atelectasis in the right lower lobe. 3. Additional incidental findings, as above. Electronically Signed   By: Trudie Reed M.D.   On: 08/18/2016 13:25   US Venous Img Lower Bilateral  Result Date: 08/19/2016 CLINICAL DATA:  Bilateral lower extremity swelling for 10 days EXAM: BILATERAL LOWER EXTREMITY VENOUS DOPPLER ULTRASOUND TECHNIQUE: Gray-scale sonography with graded compression, as well as color Doppler and duplex ultrasound were performed to evaluate the lower extremity deep venous systems from the level of the common femoral vein and including the common femoral, femoral, profunda femoral, popliteal and calf veins including the posterior tibial, peroneal and gastrocnemius veins when visible. The superficial great saphenous vein was also interrogated. Spectral Doppler was utilized to evaluate flow at rest and with distal augmentation maneuvers in the common femoral, femoral and popliteal veins. COMPARISON:  None. FINDINGS: RIGHT LOWER EXTREMITY Common Femoral Vein: No  evidence of thrombus. Normal compressibility, respiratory phasicity and response to augmentation. Saphenofemoral Junction: No evidence of thrombus. Normal compressibility and flow on color Doppler imaging. Profunda Femoral Vein: No evidence of thrombus. Normal compressibility and flow on color Doppler imaging. Femoral Vein: No evidence of thrombus. Normal compressibility, respiratory phasicity and response to augmentation. Popliteal Vein: No evidence of thrombus. Normal compressibility, respiratory phasicity and response to augmentation. Calf Veins: No evidence of thrombus. Normal compressibility and flow on color Doppler imaging. Superficial Great Saphenous Vein: No evidence of thrombus. Normal compressibility and flow on color Doppler imaging. Venous Reflux:  None. Other Findings:  None. LEFT LOWER EXTREMITY Common Femoral Vein: No evidence of thrombus. Normal  compressibility, respiratory phasicity and response to augmentation. Saphenofemoral Junction: No evidence of thrombus. Normal compressibility and flow on color Doppler imaging. Profunda Femoral Vein: No evidence of thrombus. Normal compressibility and flow on color Doppler imaging. Femoral Vein: No evidence of thrombus. Normal compressibility, respiratory phasicity and response to augmentation. Popliteal Vein: No evidence of thrombus. Normal compressibility, respiratory phasicity and response to augmentation. Calf Veins: No evidence of thrombus. Normal compressibility and flow on color Doppler imaging. Superficial Great Saphenous Vein: No evidence of thrombus. Normal compressibility and flow on color Doppler imaging. Venous Reflux:  None. Other Findings:  None. IMPRESSION: No evidence of deep venous thrombosis. Electronically Signed   By: Judie Petit.  Shick M.D.   On: 08/19/2016 08:35   US Abdomen Limited Ruq  Result Date: 08/18/2016 CLINICAL DATA:  Initial evaluation for elevated LFTs. EXAM: US ABDOMEN LIMITED - RIGHT UPPER QUADRANT COMPARISON:  None available.  FINDINGS: Gallbladder: Multiple echogenic stones present within the gallbladder lumen, largest of which measures approximately 8 mm. Gallbladder wall is markedly thickened to 11 mm. Suggestion of edema/fluid within the gallbladder wall as well. No sonographic Murphy sign elicited on exam. Common bile duct: Diameter: 5.6 mm Liver: No focal lesion identified. Within normal limits in parenchymal echogenicity. Evaluation of the left lobe somewhat limited due to shadowing bowel gas. Probable right pleural effusion noted. Incidental note made of a 3.4 x 4.1 x 2.9 cm anechoic right renal cyst. IMPRESSION: 1. Cholelithiasis with marked gallbladder wall thickening up to 11 mm. Clinical correlation for possible acute cholecystitis recommended. No biliary dilatation. 2. Right pleural effusion. 3. 3.4 x 4.1 x 2.9 cm right renal cyst. Electronically Signed   By: Rise Mu M.D.   On: 08/18/2016 00:21    EKG:  Orders placed or performed during the hospital encounter of 08/17/16  . ED EKG  . ED EKG    ASSESSMENT AND PLAN:  Active Problems:   CHF (congestive heart failure) (HCC)   Symptomatic cholelithiasis   Elevated liver enzymes #1. Acute on chronic respiratory failure with hypoxia due to COPD exacerbation and acute on chronic systolic CHF. The patient is on oxygen at home at 2 L, #2. Acute on chronic systolic CHF, continue Lasix intravenously, following coronary output, weight,   patient diuresed about 1.2 L so far  #3. COPD exacerbation, improved on current therapy with Levaquin, budesonide, Solu Medrol, albuterol. #4. Right upper quadrant abdominal pain, cholelithiasis, no choledocholithiasis, concerning for cholecystitis , continue antibiotic therapy, surgical consultation is  appreciated, surgery follow-up was recommended after discharge, , although patient may not be a surgical candidate, discussed with surgery today #5. Elevated transaminases level, total bilirubin, no obstruction, improved  LFTs #6 acute renal insufficiency, resolved with IV fluid administration #7. Elevated troponin, likely demand ischemia, conservative therapy per cardiologist #8. Anemia, get Hemoccult, no active bleeding was noted  #9. Malignant essential hypertension, initiate lisinopril, advance it as needed   Management plans discussed with the patient, family and they are in agreement.   DRUG ALLERGIES: No Active Allergies  CODE STATUS:     Code Status Orders        Start     Ordered   08/18/16 0518  Full code  Continuous     08/18/16 0517    Code Status History    Date Active Date Inactive Code Status Order ID Comments User Context   This patient has a current code status but no historical code status.    Advance Directive Documentation   Flowsheet Row Most  Recent Value  Type of Advance Directive  Healthcare Power of Attorney, Living will  Pre-existing out of facility DNR order (yellow form or pink MOST form)  No data  "MOST" Form in Place?  No data      TOTAL TIME TAKING CARE OF THIS PATIENT: 35 minutes.    Katharina Caper M.D on 08/19/2016 at 3:51 PM  Between 7am to 6pm - Pager - [phone]  After 6pm go to [email or web address] - password [email or web address] [facility]  [facility]  Office  [phone]  CC: Primary care physician; Dortha Kern, MD

## 2016-08-19 NOTE — Progress Notes (Signed)
08/19/2016  Subjective: Patient had MRCP yesterday which did not show any choledocholithiasis.  Was started on clear liquids which has not caused any nausea/vomiting or pain.  Patient reports mild discomfort over right lower quadrant, and has not had a bowel movement in 2-3 days.  Vital signs: Temp:  [97.9 F (36.6 C)-99.4 F (37.4 C)] 98.2 F (36.8 C) (01/07 0352) Pulse Rate:  [62-80] 70 (01/07 0352) Resp:  [15-16] 15 (01/07 0351) BP: (148-165)/(63-118) 165/84 (01/07 0352) SpO2:  [96 %-97 %] 96 % (01/07 0836)   Intake/Output: 01/06 0701 - 01/07 0700 In: 340 [P.O.:240; IV Piggyback:100] Out: 850 [Urine:850] Last BM Date: 08/17/16  Physical Exam: Constitutional: No acute distress. Abdomen:  Soft, nondistended, nontender to palpation over RUQ.  Negative Murphy's sign.  Mild discomfort over RLQ but no peritoneal signs.  Labs:   Recent Labs  08/17/16 2141 08/18/16 0523  WBC 11.9* 9.3  HGB 11.7* 12.3*  HCT 35.2* 36.2*  PLT 152 162    Recent Labs  08/18/16 0523 08/18/16 1731  NA 138 135  K 3.6 3.5  CL 105 99*  CO2 27 28  GLUCOSE 101* 177*  BUN 22* 22*  CREATININE 1.09 1.05  CALCIUM 8.7* 8.4*   No results for input(s): LABPROT, INR in the last 72 hours.  Imaging: Mr Abdomen Mrcp Wo Contrast  Result Date: 08/18/2016 CLINICAL DATA:  83 year old male with elevated liver function tests. Cholelithiasis and pericholecystic fluid noted on recent CT examination. EXAM: MRI ABDOMEN WITHOUT CONTRAST  (INCLUDING MRCP) TECHNIQUE: Multiplanar multisequence MR imaging of the abdomen was performed. Heavily T2-weighted images of the biliary and pancreatic ducts were obtained, and three-dimensional MRCP images were rendered by post processing. COMPARISON:  No prior abdominal MRI. Chest CT 08/17/2016. Abdominal ultrasound 08/17/2016. FINDINGS: Comment: Lack of IV gadolinium limits assessment for and characterization of visceral and/or vascular lesions. Lower chest: Small mixed signal  intensity fluid collection in the lower right hemithorax, compatible with a complex right-sided pleural effusion which may have some proteinaceous/hemorrhagic contents and appears likely partially loculated. There is some increased signal intensity in the adjacent lung parenchymal, presumably atelectasis. Signal void in the sternum related to median sternotomy wires. Hepatobiliary: No definite cystic or solid hepatic lesions are identified on today's noncontrast examination. No intra or extrahepatic biliary ductal dilatation on MRCP images. Common bile duct measures 5 mm in the porta hepatis. Several small filling defects lie dependently in the gallbladder, compatible with gallstones. Gallbladder wall appears edematous measuring up to 12 mm in thickness with a trace amount of pericholecystic fluid. Gallbladder is only moderately distended. Pancreas: No pancreatic mass or peripancreatic inflammatory changes are noted on today's noncontrast CT examination. No pancreatic ductal dilatation noted on MRCP images. Spleen:  Unremarkable. Adrenals/Urinary Tract: Multiple T1 hypointense, T2 hyperintense lesions noted in the kidneys bilaterally, incompletely characterized on today's noncontrast examination, but likely to represent cysts, measuring up to 4.8 cm in the upper pole of the right kidney. No hydroureteronephrosis in the visualized portions of the abdomen. Bilateral adrenal glands are normal in appearance. Stomach/Bowel: Visualized portions are unremarkable. Vascular/Lymphatic: No aneurysm identified in the visualized abdominal vasculature. No lymphadenopathy noted in the abdomen. Other: No significant volume of ascites noted in the visualized portions of the peritoneal cavity. Musculoskeletal: No aggressive osseous lesions are noted in the visualized portions of the skeleton. IMPRESSION: 1. Cholelithiasis with gallbladder wall edema and trace amount of pericholecystic fluid. However, gallbladder is only moderately  distended. In the appropriate clinical setting, these findings could be seen  in the setting of acute cholecystitis, however, these findings may alternatively simply reflect gallbladder wall edema secondary to biliary dysfunction. Clinical correlation is recommended. 2. Small complex partially loculated right-sided pleural effusion with some associated passive subsegmental atelectasis in the right lower lobe. 3. Additional incidental findings, as above. Electronically Signed   By: Trudie Reedaniel  Entrikin M.D.   On: 08/18/2016 13:25   [place] Venous Img Lower Bilateral  Result Date: 08/19/2016 CLINICAL DATA:  Bilateral lower extremity swelling for 10 days EXAM: BILATERAL LOWER EXTREMITY VENOUS DOPPLER ULTRASOUND TECHNIQUE: Gray-scale sonography with graded compression, as well as color Doppler and duplex ultrasound were performed to evaluate the lower extremity deep venous systems from the level of the common femoral vein and including the common femoral, femoral, profunda femoral, popliteal and calf veins including the posterior tibial, peroneal and gastrocnemius veins when visible. The superficial great saphenous vein was also interrogated. Spectral Doppler was utilized to evaluate flow at rest and with distal augmentation maneuvers in the common femoral, femoral and popliteal veins. COMPARISON:  None. FINDINGS: RIGHT LOWER EXTREMITY Common Femoral Vein: No evidence of thrombus. Normal compressibility, respiratory phasicity and response to augmentation. Saphenofemoral Junction: No evidence of thrombus. Normal compressibility and flow on color Doppler imaging. Profunda Femoral Vein: No evidence of thrombus. Normal compressibility and flow on color Doppler imaging. Femoral Vein: No evidence of thrombus. Normal compressibility, respiratory phasicity and response to augmentation. Popliteal Vein: No evidence of thrombus. Normal compressibility, respiratory phasicity and response to augmentation. Calf Veins: No evidence of  thrombus. Normal compressibility and flow on color Doppler imaging. Superficial Great Saphenous Vein: No evidence of thrombus. Normal compressibility and flow on color Doppler imaging. Venous Reflux:  None. Other Findings:  None. LEFT LOWER EXTREMITY Common Femoral Vein: No evidence of thrombus. Normal compressibility, respiratory phasicity and response to augmentation. Saphenofemoral Junction: No evidence of thrombus. Normal compressibility and flow on color Doppler imaging. Profunda Femoral Vein: No evidence of thrombus. Normal compressibility and flow on color Doppler imaging. Femoral Vein: No evidence of thrombus. Normal compressibility, respiratory phasicity and response to augmentation. Popliteal Vein: No evidence of thrombus. Normal compressibility, respiratory phasicity and response to augmentation. Calf Veins: No evidence of thrombus. Normal compressibility and flow on color Doppler imaging. Superficial Great Saphenous Vein: No evidence of thrombus. Normal compressibility and flow on color Doppler imaging. Venous Reflux:  None. Other Findings:  None. IMPRESSION: No evidence of deep venous thrombosis. Electronically Signed   By: Judie PetitM.  Shick M.D.   On: 08/19/2016 08:35    Assessment/Plan: 83 yo male admitted with shortness of breath and afib, with possible cholecystitis.  --Continue antibiotic empiric management for cholecystitis.  Recommend overall 2 week course. --No acute surgical interventions at this point. --Recommend Miralax for his constipation. --Advance diet as tolerated to low fat diet. --Will continue to follow with you.   Howie IllJose Luis Gates Jividen, MD [facility]

## 2016-08-19 NOTE — Progress Notes (Signed)
Unna's boot applied per MD order. Patient tolerated procedure. I will continue to assess.

## 2016-08-19 NOTE — Plan of Care (Signed)
Problem: Acute Rehab PT Goals(only PT should resolve) Goal: Pt Will Verbalize and Adhere to Precautions While PT Will Verbalize and Adhere to Precautions While Performing Mobility Precautions of observing O2 sats to remain above 90% with no supplemental O2

## 2016-08-19 NOTE — Progress Notes (Signed)
Brief GI note:  No evidence of choledocholithiasis LFTs improving Tolerating diet well Likely home tomorrow and f/u with surgery as out pt

## 2016-08-19 NOTE — Evaluation (Signed)
Physical Therapy Evaluation Patient Details Name: Glenn FuelRalph Henderson Heiman Jr. MRN: [id number] DOB: [birth year] Today's Date: 08/19/2016   History of Present Illness  83 yo male with onset of CHF, R pleural effusion, cholecystitis, elevated liver enzymes, mild increased troponins, PAF with PMHx:  obesity and EF 35%.  Clinical Impression  Pt is up to walk with PT using a walker and controlling O2 sats with no supplemental O2.  He is aware of the need to control pace but used information on portable O2 sat monitor to control pace well.  Never dropped below 90% x brief moment at 89% then back.  Resting sat is 94% and on O2 is 97%.  Spoke with nursing about leaving cannula off and will ck him later.  Continue acutely for further endurance training with monitoring of sats and LE strengthening.    Follow Up Recommendations Home health PT;Supervision - Intermittent    Equipment Recommendations       Recommendations for Other Services       Precautions / Restrictions Precautions Precautions: Fall (telemetry) Restrictions Weight Bearing Restrictions: No      Mobility  Bed Mobility Overal bed mobility: Modified Independent             General bed mobility comments: using elevated HOB  Transfers Overall transfer level: Needs assistance Equipment used: Rolling walker (2 wheeled) Transfers: Sit to/from [employer] CorporationStand;Stand Pivot Transfers Sit to Stand: Min guard Stand pivot transfers: Min guard       General transfer comment: pt using hand placement for powering up and needs reminding for descent to use hands  Ambulation/Gait Ambulation/Gait assistance: Min guard Ambulation Distance (Feet): 150 Feet Assistive device: Rolling walker (2 wheeled);1 person hand held assist Gait Pattern/deviations: Step-through pattern;Wide base of support;Decreased stride length Gait velocity: reduced Gait velocity interpretation: Below normal speed for age/gender General Gait Details: taking his time with  walker and using pulse ox to note O2 sats with no O2 were 90% or greater with short reminders for breathing deeply and controlling pace  Stairs            Wheelchair Mobility    Modified Rankin (Stroke Patients Only)       Balance Overall balance assessment: Needs assistance Sitting-balance support: Feet supported Sitting balance-Leahy Scale: Good     Standing balance support: Bilateral upper extremity supported Standing balance-Leahy Scale: Fair                               Pertinent Vitals/Pain Pain Assessment: No/denies pain    Home Living Family/patient expects to be discharged to:: Unsure Living Arrangements: Alone Available Help at Discharge: Friend(s);Available PRN/intermittently   Home Access: Level entry     Home Layout: One level Home Equipment: Walker - 2 wheels;Cane - single point Additional Comments: reports he typically uses SPC lately    Prior Function Level of Independence: Independent with assistive device(s)               Hand Dominance        Extremity/Trunk Assessment   Upper Extremity Assessment Upper Extremity Assessment: Overall WFL for tasks assessed    Lower Extremity Assessment Lower Extremity Assessment: Overall WFL for tasks assessed    Cervical / Trunk Assessment Cervical / Trunk Assessment: Normal  Communication   Communication: No difficulties (wearing hearind aid with no difficulty hearing PT)  Cognition Arousal/Alertness: Awake/alert Behavior During Therapy: WFL for tasks assessed/performed Overall Cognitive Status: Within Functional Limits  for tasks assessed                      General Comments      Exercises     Assessment/Plan    PT Assessment Patient needs continued PT services  PT Problem List Decreased strength;Decreased range of motion;Decreased activity tolerance;Decreased balance;Decreased mobility;Decreased coordination;Decreased knowledge of use of DME;Decreased safety  awareness;Cardiopulmonary status limiting activity;Obesity          PT Treatment Interventions DME instruction;Gait training;Functional mobility training;Therapeutic activities;Therapeutic exercise;Balance training;Neuromuscular re-education;Patient/family education    PT Goals (Current goals can be found in the Care Plan section)  Acute Rehab PT Goals Patient Stated Goal: to get home soon PT Goal Formulation: With patient Time For Goal Achievement: 09/02/16 Potential to Achieve Goals: Good    Frequency Min 2X/week   Barriers to discharge Decreased caregiver support home alone but many friends are availiable    Co-evaluation               End of Session Equipment Utilized During Treatment: Gait belt Activity Tolerance: Patient tolerated treatment well;Treatment limited secondary to medical complications (Comment) (monitored O2 and controlled pace of gait to manage) Patient left: in chair;with call bell/phone within reach Nurse Communication: Mobility status;Other (comment) (O2 off since resting values are 94% and to ck later)         Time: 1610-9604 PT Time Calculation (min) (ACUTE ONLY): 27 min   Charges:   PT Evaluation $PT Eval Low Complexity: 1 Procedure PT Treatments $Gait Training: 8-22 mins   PT G Codes:        Ivar Drape 2016-09-12, 3:43 PM   Samul Dada, PT MS Acute Rehab Dept. Number: [facility] [id number] and [facility] [phone]

## 2016-08-19 NOTE — Consult Note (Signed)
[facility] Cardiology  SUBJECTIVE: "I don't have any chest pain"   Vitals:   08/18/16 1939 08/19/16 0351 08/19/16 0352 08/19/16 0836  BP: (!) 150/80 (!) 148/118 (!) 165/84   Pulse: 69 80 70   Resp: 16 15    Temp: 99.4 F (37.4 C) 98.1 F (36.7 C) 98.2 F (36.8 C)   TempSrc: Oral Oral Oral   SpO2: 96% 96% 96% 96%  Weight:      Height:         Intake/Output Summary (Last 24 hours) at 08/19/16 1125 Last data filed at 08/19/16 0945  Gross per 24 hour  Intake              580 ml  Output             1350 ml  Net             -770 ml      PHYSICAL EXAM  General: Well developed, well nourished, in no acute distress HEENT:  Normocephalic and atramatic Neck:  No JVD.  Lungs: Clear bilaterally to auscultation and percussion. Heart: HRRR . Normal S1 and S2 without gallops or murmurs.  Abdomen: Bowel sounds are positive, abdomen soft and non-tender  Msk:  Back normal, normal gait. Normal strength and tone for age. Extremities: No clubbing, cyanosis or edema.   Neuro: Alert and oriented X 3. Psych:  Good affect, responds appropriately   LABS: Basic Metabolic Panel:  Recent Labs  16/10/96 0523 08/18/16 1731  NA 138 135  K 3.6 3.5  CL 105 99*  CO2 27 28  GLUCOSE 101* 177*  BUN 22* 22*  CREATININE 1.09 1.05  CALCIUM 8.7* 8.4*   Liver Function Tests:  Recent Labs  08/18/16 1731 08/19/16 0529  AST 212* 156*  ALT 279* 253*  ALKPHOS 200* 222*  BILITOT 3.0* 2.0*  PROT 6.4* 6.8  ALBUMIN 3.2* 3.3*   No results for input(s): LIPASE, AMYLASE in the last 72 hours. CBC:  Recent Labs  08/17/16 2141 08/18/16 0523  WBC 11.9* 9.3  NEUTROABS 11.0*  --   HGB 11.7* 12.3*  HCT 35.2* 36.2*  MCV 90.8 89.7  PLT 152 162   Cardiac Enzymes:  Recent Labs  08/18/16 0523 08/18/16 1111 08/18/16 1731  TROPONINI <0.03 0.03* 0.03*   BNP: Invalid input(s): POCBNP D-Dimer: No results for input(s): DDIMER in the last 72 hours. Hemoglobin A1C: No results for input(s): HGBA1C  in the last 72 hours. Fasting Lipid Panel: No results for input(s): CHOL, HDL, LDLCALC, TRIG, CHOLHDL, LDLDIRECT in the last 72 hours. Thyroid Function Tests: No results for input(s): TSH, T4TOTAL, T3FREE, THYROIDAB in the last 72 hours.  Invalid input(s): FREET3 Anemia Panel: No results for input(s): VITAMINB12, FOLATE, FERRITIN, TIBC, IRON, RETICCTPCT in the last 72 hours.  Dg Chest 2 View  Result Date: 08/17/2016 CLINICAL DATA:  Shortness of breath. EXAM: CHEST  2 VIEW COMPARISON:  10/11/2014. FINDINGS: Prior CABG. Cardiomegaly with normal pulmonary vascularity. Mild pleural-parenchymal thickening consistent with scarring again noted. No change. IMPRESSION: 1.  Prior CABG.  Cardiomegaly with normal pulmonary vascularity. 2. Mild bibasilar pleuroparenchymal thickening noted consistent scarring. No change. Electronically Signed   By: Maisie Fus  Register   On: 08/17/2016 16:43   Ct Angio Chest Pe W Or Wo Contrast  Result Date: 08/17/2016 CLINICAL DATA:  Dyspnea, onset yesterday. EXAM: CT ANGIOGRAPHY CHEST WITH CONTRAST TECHNIQUE: Multidetector CT imaging of the chest was performed using the standard protocol during bolus administration of intravenous contrast. Multiplanar CT  image reconstructions and MIPs were obtained to evaluate the vascular anatomy. CONTRAST:  75 mL Isovue 370 intravenous COMPARISON:  04/03/2013 FINDINGS: Cardiovascular: Satisfactory opacification of the pulmonary arteries to the segmental level. No evidence of pulmonary embolism. Stable cardiomegaly. Atherosclerotic calcifications of the aorta and coronary arteries. No pericardial effusion. Mediastinum/Nodes: No enlarged mediastinal, hilar, or axillary lymph nodes. Thyroid gland, trachea, and esophagus demonstrate no significant findings. Lungs/Pleura: Chronic loculated effusion in the right posterior base with adjacent pleuroparenchymal scarring. Milder linear scarring in the left base. This is all unchanged from 2014. No new  opacities. Airways are patent and normal in caliber. Upper Abdomen: Hiatal hernia. There is gallbladder mural thickening and pericholecystic edema/ fluid, as well as multiple intraluminal gallbladder calculi. The mural thickening and adjacent edema is new, and is suspicious for cholecystitis. Musculoskeletal: No significant skeletal lesion Review of the MIP images confirms the above findings. IMPRESSION: 1. Negative for pulmonary embolism or other acute vascular abnormality. 2. Chronic loculated pleural collection in the right posterior base with adjacent pleuroparenchymal scarring, unchanged. 3. Gallbladder mural thickening and pericholecystic edema. Cholelithiasis. Findings are concerning for acute cholecystitis. Recommend right upper quadrant ultrasound. 4. Hiatal hernia. 5. These results will be called to the ordering clinician or representative by the Radiologist Assistant, and communication documented in the PACS or zVision Dashboard. Electronically Signed   By: Ellery Plunk M.D.   On: 08/17/2016 23:30   Mr Abdomen Mrcp Wo Contrast  Result Date: 08/18/2016 CLINICAL DATA:  83 year old male with elevated liver function tests. Cholelithiasis and pericholecystic fluid noted on recent CT examination. EXAM: MRI ABDOMEN WITHOUT CONTRAST  (INCLUDING MRCP) TECHNIQUE: Multiplanar multisequence MR imaging of the abdomen was performed. Heavily T2-weighted images of the biliary and pancreatic ducts were obtained, and three-dimensional MRCP images were rendered by post processing. COMPARISON:  No prior abdominal MRI. Chest CT 08/17/2016. Abdominal ultrasound 08/17/2016. FINDINGS: Comment: Lack of IV gadolinium limits assessment for and characterization of visceral and/or vascular lesions. Lower chest: Small mixed signal intensity fluid collection in the lower right hemithorax, compatible with a complex right-sided pleural effusion which may have some proteinaceous/hemorrhagic contents and appears likely partially  loculated. There is some increased signal intensity in the adjacent lung parenchymal, presumably atelectasis. Signal void in the sternum related to median sternotomy wires. Hepatobiliary: No definite cystic or solid hepatic lesions are identified on today's noncontrast examination. No intra or extrahepatic biliary ductal dilatation on MRCP images. Common bile duct measures 5 mm in the porta hepatis. Several small filling defects lie dependently in the gallbladder, compatible with gallstones. Gallbladder wall appears edematous measuring up to 12 mm in thickness with a trace amount of pericholecystic fluid. Gallbladder is only moderately distended. Pancreas: No pancreatic mass or peripancreatic inflammatory changes are noted on today's noncontrast CT examination. No pancreatic ductal dilatation noted on MRCP images. Spleen:  Unremarkable. Adrenals/Urinary Tract: Multiple T1 hypointense, T2 hyperintense lesions noted in the kidneys bilaterally, incompletely characterized on today's noncontrast examination, but likely to represent cysts, measuring up to 4.8 cm in the upper pole of the right kidney. No hydroureteronephrosis in the visualized portions of the abdomen. Bilateral adrenal glands are normal in appearance. Stomach/Bowel: Visualized portions are unremarkable. Vascular/Lymphatic: No aneurysm identified in the visualized abdominal vasculature. No lymphadenopathy noted in the abdomen. Other: No significant volume of ascites noted in the visualized portions of the peritoneal cavity. Musculoskeletal: No aggressive osseous lesions are noted in the visualized portions of the skeleton. IMPRESSION: 1. Cholelithiasis with gallbladder wall edema and trace amount of pericholecystic  fluid. However, gallbladder is only moderately distended. In the appropriate clinical setting, these findings could be seen in the setting of acute cholecystitis, however, these findings may alternatively simply reflect gallbladder wall edema  secondary to biliary dysfunction. Clinical correlation is recommended. 2. Small complex partially loculated right-sided pleural effusion with some associated passive subsegmental atelectasis in the right lower lobe. 3. Additional incidental findings, as above. Electronically Signed   By: Trudie Reed M.D.   On: 08/18/2016 13:25   US Venous Img Lower Bilateral  Result Date: 08/19/2016 CLINICAL DATA:  Bilateral lower extremity swelling for 10 days EXAM: BILATERAL LOWER EXTREMITY VENOUS DOPPLER ULTRASOUND TECHNIQUE: Gray-scale sonography with graded compression, as well as color Doppler and duplex ultrasound were performed to evaluate the lower extremity deep venous systems from the level of the common femoral vein and including the common femoral, femoral, profunda femoral, popliteal and calf veins including the posterior tibial, peroneal and gastrocnemius veins when visible. The superficial great saphenous vein was also interrogated. Spectral Doppler was utilized to evaluate flow at rest and with distal augmentation maneuvers in the common femoral, femoral and popliteal veins. COMPARISON:  None. FINDINGS: RIGHT LOWER EXTREMITY Common Femoral Vein: No evidence of thrombus. Normal compressibility, respiratory phasicity and response to augmentation. Saphenofemoral Junction: No evidence of thrombus. Normal compressibility and flow on color Doppler imaging. Profunda Femoral Vein: No evidence of thrombus. Normal compressibility and flow on color Doppler imaging. Femoral Vein: No evidence of thrombus. Normal compressibility, respiratory phasicity and response to augmentation. Popliteal Vein: No evidence of thrombus. Normal compressibility, respiratory phasicity and response to augmentation. Calf Veins: No evidence of thrombus. Normal compressibility and flow on color Doppler imaging. Superficial Great Saphenous Vein: No evidence of thrombus. Normal compressibility and flow on color Doppler imaging. Venous Reflux:   None. Other Findings:  None. LEFT LOWER EXTREMITY Common Femoral Vein: No evidence of thrombus. Normal compressibility, respiratory phasicity and response to augmentation. Saphenofemoral Junction: No evidence of thrombus. Normal compressibility and flow on color Doppler imaging. Profunda Femoral Vein: No evidence of thrombus. Normal compressibility and flow on color Doppler imaging. Femoral Vein: No evidence of thrombus. Normal compressibility, respiratory phasicity and response to augmentation. Popliteal Vein: No evidence of thrombus. Normal compressibility, respiratory phasicity and response to augmentation. Calf Veins: No evidence of thrombus. Normal compressibility and flow on color Doppler imaging. Superficial Great Saphenous Vein: No evidence of thrombus. Normal compressibility and flow on color Doppler imaging. Venous Reflux:  None. Other Findings:  None. IMPRESSION: No evidence of deep venous thrombosis. Electronically Signed   By: Judie Petit.  Shick M.D.   On: 08/19/2016 08:35   US Abdomen Limited Ruq  Result Date: 08/18/2016 CLINICAL DATA:  Initial evaluation for elevated LFTs. EXAM: US ABDOMEN LIMITED - RIGHT UPPER QUADRANT COMPARISON:  None available. FINDINGS: Gallbladder: Multiple echogenic stones present within the gallbladder lumen, largest of which measures approximately 8 mm. Gallbladder wall is markedly thickened to 11 mm. Suggestion of edema/fluid within the gallbladder wall as well. No sonographic Murphy sign elicited on exam. Common bile duct: Diameter: 5.6 mm Liver: No focal lesion identified. Within normal limits in parenchymal echogenicity. Evaluation of the left lobe somewhat limited due to shadowing bowel gas. Probable right pleural effusion noted. Incidental note made of a 3.4 x 4.1 x 2.9 cm anechoic right renal cyst. IMPRESSION: 1. Cholelithiasis with marked gallbladder wall thickening up to 11 mm. Clinical correlation for possible acute cholecystitis recommended. No biliary dilatation. 2.  Right pleural effusion. 3. 3.4 x 4.1 x 2.9 cm right  renal cyst. Electronically Signed   By: Rise MuBenjamin  McClintock M.D.   On: 08/18/2016 00:21     Echo   TELEMETRY: Normal sinus rhythm with frequent PACs:  ASSESSMENT AND PLAN:  Active Problems:   CHF (congestive heart failure) (HCC)   Symptomatic cholelithiasis   Elevated liver enzymes    1. Exertional dyspnea, without evidence for CHF, nondiagnostic ECG, and negative troponin 2. Paroxysmal atrial fibrillation, currently in sinus rhythm 3. Acute cholecystitis per GI  Recommendations  1. Continue current therapy 2. Defer initiating anticoagulation for paroxysmal atrial fibrillation at this time in the setting of acute cholecystitis 3. Follow-up with Dr. Juliann Paresallwood 4. No further cardiac diagnostics at this time  Signed off for now, please call if any questions   Marcina MillardAlexander Drucilla Cumber, MD, PhD, [facility] 08/19/2016 11:25 AM

## 2016-08-20 DIAGNOSIS — K811 Chronic cholecystitis: Secondary | ICD-10-CM

## 2016-08-20 DIAGNOSIS — R7401 Elevation of levels of liver transaminase levels: Secondary | ICD-10-CM

## 2016-08-20 DIAGNOSIS — R7989 Other specified abnormal findings of blood chemistry: Secondary | ICD-10-CM

## 2016-08-20 DIAGNOSIS — K810 Acute cholecystitis: Secondary | ICD-10-CM

## 2016-08-20 DIAGNOSIS — I10 Essential (primary) hypertension: Secondary | ICD-10-CM

## 2016-08-20 DIAGNOSIS — R778 Other specified abnormalities of plasma proteins: Secondary | ICD-10-CM

## 2016-08-20 DIAGNOSIS — D649 Anemia, unspecified: Secondary | ICD-10-CM

## 2016-08-20 DIAGNOSIS — R740 Nonspecific elevation of levels of transaminase and lactic acid dehydrogenase [LDH]: Secondary | ICD-10-CM

## 2016-08-20 DIAGNOSIS — K8020 Calculus of gallbladder without cholecystitis without obstruction: Secondary | ICD-10-CM

## 2016-08-20 DIAGNOSIS — J441 Chronic obstructive pulmonary disease with (acute) exacerbation: Secondary | ICD-10-CM

## 2016-08-20 DIAGNOSIS — J9621 Acute and chronic respiratory failure with hypoxia: Secondary | ICD-10-CM

## 2016-08-20 LAB — HEPATIC FUNCTION PANEL
ALK PHOS: 204 U/L — AB (ref 38–126)
ALT: 189 U/L — AB (ref 17–63)
AST: 75 U/L — AB (ref 15–41)
Albumin: 3.3 g/dL — ABNORMAL LOW (ref 3.5–5.0)
BILIRUBIN DIRECT: 0.4 mg/dL (ref 0.1–0.5)
BILIRUBIN INDIRECT: 0.5 mg/dL (ref 0.3–0.9)
BILIRUBIN TOTAL: 0.9 mg/dL (ref 0.3–1.2)
Total Protein: 6.7 g/dL (ref 6.5–8.1)

## 2016-08-20 MED ORDER — LISINOPRIL 10 MG PO TABS: 10.0000 mg | ORAL_TABLET | Freq: Every day | ORAL | 6 refills | Status: DC

## 2016-08-20 MED ORDER — METRONIDAZOLE 500 MG PO TABS: 500.0000 mg | ORAL_TABLET | Freq: Three times a day (TID) | ORAL | 0 refills | Status: DC

## 2016-08-20 MED ORDER — LEVOFLOXACIN 500 MG PO TABS: 500.0000 mg | ORAL_TABLET | Freq: Every day | ORAL | 0 refills | Status: DC

## 2016-08-20 MED ORDER — PREDNISONE 10 MG (21) PO TBPK: 10.0000 mg | ORAL_TABLET | Freq: Every day | ORAL | 0 refills | Status: DC

## 2016-08-20 MED ORDER — BUDESONIDE-FORMOTEROL FUMARATE 160-4.5 MCG/ACT IN AERO: 2.0000 | INHALATION_SPRAY | Freq: Two times a day (BID) | RESPIRATORY_TRACT | 12 refills | Status: DC

## 2016-08-20 NOTE — Clinical Social Work Note (Signed)
CSW received referral for SNF.  Case discussed with case manager and plan is to discharge home with home health.  CSW to sign off please re-consult if social work needs arise.  [place] R. Baruch Lewers, MSW, LCSWA [phone]   

## 2016-08-20 NOTE — Discharge Summary (Signed)
[facility] [place] [facility]   PATIENT NAME: Glenn PancoastRalph Thomas    MR#:  [id number]  DATE OF BIRTH:  [birth year]  DATE OF ADMISSION:  08/17/2016 ADMITTING PHYSICIAN: Ihor AustinPavan Pyreddy, MD  DATE OF DISCHARGE: 08/20/2016  3:33 PM  PRIMARY CARE PHYSICIAN: BLISS, Doreene NestLAURA K, MD     ADMISSION DIAGNOSIS:  Chronic cholecystitis [K81.1] Shortness of breath [R06.02] Peripheral edema [R60.9] Elevated liver enzymes [R74.8] New onset atrial fibrillation (HCC) [I48.91] Elevated bilirubin [R17] Elevated d-dimer [R79.89]  DISCHARGE DIAGNOSIS:  Active Problems:   Acute on chronic systolic CHF (congestive heart failure) (HCC)   COPD exacerbation (HCC)   Cholecystitis, acute   Cholelithiasis   Symptomatic cholelithiasis   Elevated liver enzymes   Elevated transaminase level   Elevated troponin   Essential hypertension, malignant   Anemia   Acute on chronic respiratory failure with hypoxia (HCC)   SECONDARY DIAGNOSIS:   Past Medical History:  Diagnosis Date  . Aortic sclerosis    a. by echo 01/2010 w/ systolic murmur  . CHF (congestive heart failure) (HCC)   . Coronary artery disease    a. 02/2010 s/p CABG x 3 - [place]->LAD, VG->Ramus, VG->D1.  Marland Kitchen. Hyperlipidemia   . Ischemic cardiomyopathy   . Labile hypertension   . Obesity   . Systolic CHF, chronic (HCC)    a. 01/2010 Echo - EF 35%    .pro HOSPITAL COURSE:  The patient is 83 year old male with a history significant for history of CHF, coronary artery disease, coronary artery bypass grafting, hyperlipidemia, cardiomyopathy, hypertension, who presented to the hospital with complaints of shortness of breath for the last few days, back pain, but no chest pain. He also admitted of some cough, dry , right upper quadrant abdominal discomfort.. On arrival to the hospital patient was noted to have elevated alkaline phosphatase, total bilirubin, liver function tests, AST,  ALT 400/500. Studies revealed thickened gallbladder  wall, gallstones, but no choledocholithiasis. The patient was seen by gastroenterologist and surgeon, he was felt to be nonsurgical candidate and so he was initiated on antibiotic therapy due to concerns of cholecystitis. He was also seen by cardiologist due to mild elevation of troponin, and felt that he had demand ischemia, but not acute coronary syndrome. With conservative therapy. His condition improved and he was felt to be stable to be discharged home. He was seen by physical therapist and recommended home health services. Discussion by problem #1. Acute on chronic respiratory failure with hypoxia due to COPD exacerbation and acute on chronic systolic CHF. The patient was diuresed and treated with antibiotic therapy, steroids, inhalation therapy and improved clinically, he was weaned down to his usual 2 L per nasal cannulas.   #2. Acute on chronic systolic CHF, continue Lasix orally, following weight at home,   patient diuresed about 2.8 L while in the hospital, It is recommended to repeat patient's echocardiogram as outpatient, patient is to follow up with cardiologist  #3. COPD exacerbation, improved on current therapy with Levaquin, budesonide, Solu Medrol, albuterol.The patient is to continue antibiotics to complete course, steroid taper  #4.  acute cholecystitis, cholelithiasis, no choledocholithiasis, continue antibiotic therapy with levofloxacin/Flagyl to complete 10 day course, surgical consultation is  appreciated, surgery follow-up was recommended after discharge, , although patient may not be a surgical candidate due to his chronic medical problems  #5. Elevated transaminases level, total bilirubin, no obstruction, improved LFTsI will follow LFTs as outpatient  #6 acute renal insufficiency, resolved with IV fluid administration #7. Elevated  troponin, likely demand ischemia, conservative therapy per cardiologist, Echocardiogram as outpatient  #8. Anemia, Hemoccult oriented, not received, no  active bleeding was noted, Hemoglobin level was stable   #9. Malignant essential hypertension, initiated lisinopril, advance it as needed as outpatient following kidney function  DISCHARGE CONDITIONS:   Stable  CONSULTS OBTAINED:  Treatment Team:  Marcina Millard, MD Henrene Dodge, MD Toney Reil, MD  DRUG ALLERGIES:  No Active Allergies  DISCHARGE MEDICATIONS:   Discharge Medication List as of 08/20/2016  2:56 PM    START taking these medications   Details  budesonide-formoterol (SYMBICORT) 160-4.5 MCG/ACT inhaler Inhale 2 puffs into the lungs 2 (two) times daily., Starting Mon 08/20/2016, Normal    levofloxacin (LEVAQUIN) 500 MG tablet Take 1 tablet (500 mg total) by mouth daily., Starting Mon 08/20/2016, Normal    lisinopril (PRINIVIL,ZESTRIL) 10 MG tablet Take 1 tablet (10 mg total) by mouth daily., Starting Mon 08/20/2016, Normal    metroNIDAZOLE (FLAGYL) 500 MG tablet Take 1 tablet (500 mg total) by mouth 3 (three) times daily., Starting Mon 08/20/2016, Normal    predniSONE (STERAPRED UNI-PAK 21 TAB) 10 MG (21) TBPK tablet Take 1 tablet (10 mg total) by mouth daily. Please take 6 pills in the morning on the day 1, then taper by one pill daily until finished, thank you, Starting Mon 08/20/2016, Normal      CONTINUE these medications which have NOT CHANGED   Details  albuterol (PROVENTIL) (2.5 MG/3ML) 0.083% nebulizer solution Inhale 2.5 mLs into the lungs every 6 (six) hours as needed., Historical Med    aspirin 81 MG tablet Take 81 mg by mouth daily.  , Historical Med    atorvastatin (LIPITOR) 80 MG tablet Take 1 tablet (80 mg total) by mouth daily., Starting Wed 02/28/2011, Normal    carvedilol (COREG) 12.5 MG tablet Take 12.5 mg by mouth 2 (two) times daily with a meal. , Historical Med    furosemide (LASIX) 40 MG tablet Take 40 mg by mouth daily.  , Historical Med    magnesium oxide (MAG-OX) 400 MG tablet Take 400 mg by mouth daily., Historical Med    omeprazole  (PRILOSEC) 20 MG capsule Take 20 mg by mouth daily.  , Historical Med    potassium chloride (KLOR-CON 10) 10 MEQ tablet Take 10 mEq by mouth daily., Starting Fri 02/25/2015, Historical Med         DISCHARGE INSTRUCTIONS:    The patient is to follow-up with primary care physician, cardiologist, surgeon as outpatient  If you experience worsening of your admission symptoms, develop shortness of breath, life threatening emergency, suicidal or homicidal thoughts you must seek medical attention immediately by calling 911 or calling your MD immediately  if symptoms less severe.  You Must read complete instructions/literature along with all the possible adverse reactions/side effects for all the Medicines you take and that have been prescribed to you. Take any new Medicines after you have completely understood and accept all the possible adverse reactions/side effects.   Please note  You were cared for by a hospitalist during your hospital stay. If you have any questions about your discharge medications or the care you received while you were in the hospital after you are discharged, you can call the unit and asked to speak with the hospitalist on call if the hospitalist that took care of you is not available. Once you are discharged, your primary care physician will handle any further medical issues. Please note that NO REFILLS for any  discharge medications will be authorized once you are discharged, as it is imperative that you return to your primary care physician (or establish a relationship with a primary care physician if you do not have one) for your aftercare needs so that they can reassess your need for medications and monitor your lab values.    Today   CHIEF COMPLAINT:   Chief Complaint  Patient presents with  . Shortness of Breath    HISTORY OF PRESENT ILLNESS:  [name]  is a 83 y.o. male with a known history of CHF, coronary artery disease, coronary artery bypass grafting,  hyperlipidemia, cardiomyopathy, hypertension, who presented to the hospital with complaints of shortness of breath for the last few days, back pain, but no chest pain. He also admitted of some cough, dry , right upper quadrant abdominal discomfort.. On arrival to the hospital patient was noted to have elevated alkaline phosphatase, total bilirubin, liver function tests, AST,  ALT 400/500. Studies revealed thickened gallbladder wall, gallstones, but no choledocholithiasis. The patient was seen by gastroenterologist and surgeon, he was felt to be nonsurgical candidate and so he was initiated on antibiotic therapy due to concerns of cholecystitis. He was also seen by cardiologist due to mild elevation of troponin, and felt that he had demand ischemia, but not acute coronary syndrome. With conservative therapy. His condition improved and he was felt to be stable to be discharged home. He was seen by physical therapist and recommended home health services. Discussion by problem #1. Acute on chronic respiratory failure with hypoxia due to COPD exacerbation and acute on chronic systolic CHF. The patient was diuresed and treated with antibiotic therapy, steroids, inhalation therapy and improved clinically, he was weaned down to his usual 2 L per nasal cannulas.   #2. Acute on chronic systolic CHF, continue Lasix orally, following weight at home,   patient diuresed about 2.8 L while in the hospital, It is recommended to repeat patient's echocardiogram as outpatient, patient is to follow up with cardiologist  #3. COPD exacerbation, improved on current therapy with Levaquin, budesonide, Solu Medrol, albuterol.The patient is to continue antibiotics to complete course, steroid taper  #4.  acute cholecystitis, cholelithiasis, no choledocholithiasis, continue antibiotic therapy with levofloxacin/Flagyl to complete 10 day course, surgical consultation is  appreciated, surgery follow-up was recommended after discharge, ,  although patient may not be a surgical candidate due to his chronic medical problems  #5. Elevated transaminases level, total bilirubin, no obstruction, improved LFTsI will follow LFTs as outpatient  #6 acute renal insufficiency, resolved with IV fluid administration #7. Elevated troponin, likely demand ischemia, conservative therapy per cardiologist, Echocardiogram as outpatient  #8. Anemia, Hemoccult oriented, not received, no active bleeding was noted, Hemoglobin level was stable   #9. Malignant essential hypertension, initiated lisinopril, advance it as needed as outpatient following kidney function     VITAL SIGNS:  Blood pressure (!) 173/72, pulse 84, temperature 98 F (36.7 C), temperature source Oral, resp. rate 18, height 5\' 10"  (1.778 m), weight 90.9 kg (200 lb 4.8 oz), SpO2 92 %.  I/O:   Intake/Output Summary (Last 24 hours) at 08/20/16 1608 Last data filed at 08/20/16 1010  Gross per 24 hour  Intake             1000 ml  Output             2625 ml  Net            -1625 ml    PHYSICAL EXAMINATION:  GENERAL:  83 y.o.-year-old patient lying in the bed with no acute distress.  EYES: Pupils equal, round, reactive to light and accommodation. No scleral icterus. Extraocular muscles intact.  HEENT: Head atraumatic, normocephalic. Oropharynx and nasopharynx clear.  NECK:  Supple, no jugular venous distention. No thyroid enlargement, no tenderness.  LUNGS: Normal breath sounds bilaterally, no wheezing, rales,rhonchi or crepitation. No use of accessory muscles of respiration.  CARDIOVASCULAR: S1, S2 normal. No murmurs, rubs, or gallops.  ABDOMEN: Soft, non-tender, non-distended. Bowel sounds present. No organomegaly or mass.  EXTREMITIES: No pedal edema, cyanosis, or clubbing.  NEUROLOGIC: Cranial nerves II through XII are intact. Muscle strength 5/5 in all extremities. Sensation intact. Gait not checked.  PSYCHIATRIC: The patient is alert and oriented x 3.  SKIN: No obvious rash,  lesion, or ulcer.   DATA REVIEW:   CBC  Recent Labs Lab 08/18/16 0523  WBC 9.3  HGB 12.3*  HCT 36.2*  PLT 162    Chemistries   Recent Labs Lab 08/18/16 1731  08/20/16 0626  NA 135  --   --   K 3.5  --   --   CL 99*  --   --   CO2 28  --   --   GLUCOSE 177*  --   --   BUN 22*  --   --   CREATININE 1.05  --   --   CALCIUM 8.4*  --   --   AST 212*  < > 75*  ALT 279*  < > 189*  ALKPHOS 200*  < > 204*  BILITOT 3.0*  < > 0.9  < > = values in this interval not displayed.  Cardiac Enzymes  Recent Labs Lab 08/18/16 1731  TROPONINI 0.03*    Microbiology Results  Results for orders placed or performed in visit on 05/06/13  Culture, fungus without smear     Status: None   Collection Time: 05/06/13  1:40 PM  Result Value Ref Range Status   Micro Text Report   Final       COMMENT                   NO YEAST OR FUNGUS ISOLATED IN 21 DAYS   ANTIBIOTIC                                                        RADIOLOGY:  No results found.  EKG:   Orders placed or performed during the hospital encounter of 08/17/16  . ED EKG  . ED EKG      Management plans discussed with the patient, family and they are in agreement.  CODE STATUS:     Code Status Orders        Start     Ordered   08/18/16 0518  Full code  Continuous     08/18/16 0517    Code Status History    Date Active Date Inactive Code Status Order ID Comments User Context   This patient has a current code status but no historical code status.    Advance Directive Documentation   Flowsheet Row Most Recent Value  Type of Advance Directive  Healthcare Power of Attorney, Living will  Pre-existing out of facility DNR order (yellow form or pink MOST form)  No data  "MOST" Form in  Place?  No data      TOTAL TIME TAKING CARE OF THIS PATIENT: 40  minutes.    Katharina Caper M.D on 08/20/2016 at 4:08 PM  Between 7am to 6pm - Pager - [phone]  After 6pm go to [email or web address] - password [email or web address]  [facility]  [place] [facility]  Office  [phone]  CC: Primary care physician; Dortha Kern, MD

## 2016-08-20 NOTE — Progress Notes (Addendum)
CC: cholecystitis on debilitated pts w multiple medical issues ( CHF, COPD )  Subjective:  Tolerating full liquids, no N/V. No abdominal pain  Objective: Vital signs in last 24 hours: Temp:  [97.7 F (36.5 C)-99.8 F (37.7 C)] 98.6 F (37 C) (01/08 0415) Pulse Rate:  [71-88] 86 (01/08 0436) Resp:  [16-20] 18 (01/08 0415) BP: (157-186)/(77-92) 167/89 (01/08 0436) SpO2:  [95 %-97 %] 97 % (01/08 0739) Weight:  [90.9 kg (200 lb 4.8 oz)] 90.9 kg (200 lb 4.8 oz) (01/08 0415) Last BM Date: 08/19/16  Intake/Output from previous day: 01/07 0701 - 01/08 0700 In: 480 [P.O.:480] Out: 2825 [Urine:2825] Intake/Output this shift: Total I/O In: -  Out: 300 [Urine:300]  Physical exam: Debilitated  Elderly male in NAD Abd: soft, NT, no peritonitis, no murphy Ext: no edema, well perfused Neuro: awake alert, CN intact, no focal deficits  Lab Results: CBC   Recent Labs  08/17/16 2141 08/18/16 0523  WBC 11.9* 9.3  HGB 11.7* 12.3*  HCT 35.2* 36.2*  PLT 152 162   BMET  Recent Labs  08/18/16 0523 08/18/16 1731  NA 138 135  K 3.6 3.5  CL 105 99*  CO2 27 28  GLUCOSE 101* 177*  BUN 22* 22*  CREATININE 1.09 1.05  CALCIUM 8.7* 8.4*   PT/INR No results for input(s): LABPROT, INR in the last 72 hours. ABG No results for input(s): PHART, HCO3 in the last 72 hours.  Invalid input(s): PCO2, PO2  Studies/Results: Mr Abdomen Mrcp Wo Contrast  Result Date: 08/18/2016 CLINICAL DATA:  83 year old male with elevated liver function tests. Cholelithiasis and pericholecystic fluid noted on recent CT examination. EXAM: MRI ABDOMEN WITHOUT CONTRAST  (INCLUDING MRCP) TECHNIQUE: Multiplanar multisequence MR imaging of the abdomen was performed. Heavily T2-weighted images of the biliary and pancreatic ducts were obtained, and three-dimensional MRCP images were rendered by post processing. COMPARISON:  No prior abdominal MRI. Chest CT 08/17/2016. Abdominal ultrasound 08/17/2016. FINDINGS:  Comment: Lack of IV gadolinium limits assessment for and characterization of visceral and/or vascular lesions. Lower chest: Small mixed signal intensity fluid collection in the lower right hemithorax, compatible with a complex right-sided pleural effusion which may have some proteinaceous/hemorrhagic contents and appears likely partially loculated. There is some increased signal intensity in the adjacent lung parenchymal, presumably atelectasis. Signal void in the sternum related to median sternotomy wires. Hepatobiliary: No definite cystic or solid hepatic lesions are identified on today's noncontrast examination. No intra or extrahepatic biliary ductal dilatation on MRCP images. Common bile duct measures 5 mm in the porta hepatis. Several small filling defects lie dependently in the gallbladder, compatible with gallstones. Gallbladder wall appears edematous measuring up to 12 mm in thickness with a trace amount of pericholecystic fluid. Gallbladder is only moderately distended. Pancreas: No pancreatic mass or peripancreatic inflammatory changes are noted on today's noncontrast CT examination. No pancreatic ductal dilatation noted on MRCP images. Spleen:  Unremarkable. Adrenals/Urinary Tract: Multiple T1 hypointense, T2 hyperintense lesions noted in the kidneys bilaterally, incompletely characterized on today's noncontrast examination, but likely to represent cysts, measuring up to 4.8 cm in the upper pole of the right kidney. No hydroureteronephrosis in the visualized portions of the abdomen. Bilateral adrenal glands are normal in appearance. Stomach/Bowel: Visualized portions are unremarkable. Vascular/Lymphatic: No aneurysm identified in the visualized abdominal vasculature. No lymphadenopathy noted in the abdomen. Other: No significant volume of ascites noted in the visualized portions of the peritoneal cavity. Musculoskeletal: No aggressive osseous lesions are noted in the visualized portions of the  skeleton.  IMPRESSION: 1. Cholelithiasis with gallbladder wall edema and trace amount of pericholecystic fluid. However, gallbladder is only moderately distended. In the appropriate clinical setting, these findings could be seen in the setting of acute cholecystitis, however, these findings may alternatively simply reflect gallbladder wall edema secondary to biliary dysfunction. Clinical correlation is recommended. 2. Small complex partially loculated right-sided pleural effusion with some associated passive subsegmental atelectasis in the right lower lobe. 3. Additional incidental findings, as above. Electronically Signed   By: Trudie Reedaniel  Entrikin M.D.   On: 08/18/2016 13:25   [place] Venous Img Lower Bilateral  Result Date: 08/19/2016 CLINICAL DATA:  Bilateral lower extremity swelling for 10 days EXAM: BILATERAL LOWER EXTREMITY VENOUS DOPPLER ULTRASOUND TECHNIQUE: Gray-scale sonography with graded compression, as well as color Doppler and duplex ultrasound were performed to evaluate the lower extremity deep venous systems from the level of the common femoral vein and including the common femoral, femoral, profunda femoral, popliteal and calf veins including the posterior tibial, peroneal and gastrocnemius veins when visible. The superficial great saphenous vein was also interrogated. Spectral Doppler was utilized to evaluate flow at rest and with distal augmentation maneuvers in the common femoral, femoral and popliteal veins. COMPARISON:  None. FINDINGS: RIGHT LOWER EXTREMITY Common Femoral Vein: No evidence of thrombus. Normal compressibility, respiratory phasicity and response to augmentation. Saphenofemoral Junction: No evidence of thrombus. Normal compressibility and flow on color Doppler imaging. Profunda Femoral Vein: No evidence of thrombus. Normal compressibility and flow on color Doppler imaging. Femoral Vein: No evidence of thrombus. Normal compressibility, respiratory phasicity and response to augmentation. Popliteal  Vein: No evidence of thrombus. Normal compressibility, respiratory phasicity and response to augmentation. Calf Veins: No evidence of thrombus. Normal compressibility and flow on color Doppler imaging. Superficial Great Saphenous Vein: No evidence of thrombus. Normal compressibility and flow on color Doppler imaging. Venous Reflux:  None. Other Findings:  None. LEFT LOWER EXTREMITY Common Femoral Vein: No evidence of thrombus. Normal compressibility, respiratory phasicity and response to augmentation. Saphenofemoral Junction: No evidence of thrombus. Normal compressibility and flow on color Doppler imaging. Profunda Femoral Vein: No evidence of thrombus. Normal compressibility and flow on color Doppler imaging. Femoral Vein: No evidence of thrombus. Normal compressibility, respiratory phasicity and response to augmentation. Popliteal Vein: No evidence of thrombus. Normal compressibility, respiratory phasicity and response to augmentation. Calf Veins: No evidence of thrombus. Normal compressibility and flow on color Doppler imaging. Superficial Great Saphenous Vein: No evidence of thrombus. Normal compressibility and flow on color Doppler imaging. Venous Reflux:  None. Other Findings:  None. IMPRESSION: No evidence of deep venous thrombosis. Electronically Signed   By: Judie PetitM.  Shick M.D.   On: 08/19/2016 08:35    Anti-infectives: Anti-infectives    Start     Dose/Rate Route Frequency Ordered Stop   08/20/16 1000  levofloxacin (LEVAQUIN) tablet 500 mg     500 mg Oral Daily 08/19/16 1556     08/19/16 0600  levofloxacin (LEVAQUIN) IVPB 500 mg  Status:  Discontinued     500 mg 100 mL/hr over 60 Minutes Intravenous Every 24 hours 08/18/16 1710 08/19/16 1556   08/18/16 1230  ciprofloxacin (CIPRO) IVPB 400 mg  Status:  Discontinued     400 mg 200 mL/hr over 60 Minutes Intravenous Every 12 hours 08/18/16 1114 08/18/16 1710   08/18/16 1230  metroNIDAZOLE (FLAGYL) IVPB 500 mg     500 mg 100 mL/hr over 60 Minutes  Intravenous Every 8 hours 08/18/16 1114        Assessment/Plan:  Cholecystitis responsive to antibiotic rx No need for surgical intervention Continue A/B for total of 10 day May advance Diet We will be available, Please call us with any surgical concerns  Sterling Big, MD, FACS  08/20/2016

## 2016-08-20 NOTE — Care Management (Addendum)
Patient resides in the independent living section of [facility].  He  feels he is going to require skilled nursing stay due to his debility from his current illness.  Physical therapy recommended home health.  Patient is medically stable for discharge today and does not have a 3 night qualifying stay. Explained this to patient- says that he can not pay out of pocket for skilled nursing. Spoke with attending and informed that patient is indeed medically stable for discharge today. Unna boots have been applied to bilateral lower extremities.  Confirmed that patient home 02 is nocturnal with [facility]. he has qualified for continuous .  Sent order to [facility] and portable tank delivered to room.  Unna boots have been ordered and applied. They are to be changed weekly.  Patient is agreeable to home health and there is no agency preference.   No agency preference. Referral called and accepted by [facility] for SN PT OT Aide and social work.

## 2016-08-20 NOTE — Care Management Important Message (Signed)
Important Message  Patient Details  Name: Glenn FuelRalph Henderson Burgin Jr. MRN: [id number] Date of Birth: Nov 29, 1925   Medicare Important Message Given:  Yes Initial signed IM printed from Epic and given to patient.    Eber HongGreene, Joell Usman R, RN 08/20/2016, 9:18 AM

## 2016-08-20 NOTE — Progress Notes (Signed)
IV and tele removed from patient. Discharge instructions given to patient along with hard copy prescription. Patient verbalized understanding. Patient hooked to home 02 and no distress at this time. Friend at bedside and will be transporting patient home.

## 2016-08-20 NOTE — Progress Notes (Signed)
SATURATION QUALIFICATIONS: (This note is used to comply with regulatory documentation for home oxygen) ° °Patient Saturations on Room Air at Rest = 95% ° °Patient Saturations on Room Air while Ambulating = 88% ° °Patient Saturations on 2 Liters of oxygen while Ambulating = 96% ° °Please briefly explain why patient needs home oxygen: °

## 2016-09-10 ENCOUNTER — Ambulatory Visit (INDEPENDENT_AMBULATORY_CARE_PROVIDER_SITE_OTHER): Payer: Medicare Other

## 2016-09-10 ENCOUNTER — Encounter (INDEPENDENT_AMBULATORY_CARE_PROVIDER_SITE_OTHER): Payer: Self-pay | Admitting: Vascular Surgery

## 2016-09-10 ENCOUNTER — Other Ambulatory Visit (INDEPENDENT_AMBULATORY_CARE_PROVIDER_SITE_OTHER): Payer: Self-pay | Admitting: Vascular Surgery

## 2016-09-10 ENCOUNTER — Ambulatory Visit (INDEPENDENT_AMBULATORY_CARE_PROVIDER_SITE_OTHER): Payer: Medicare Other | Admitting: Vascular Surgery

## 2016-09-10 VITALS — BP 112/66 | HR 70 | Resp 16 | Ht 66.0 in | Wt 206.0 lb

## 2016-09-10 DIAGNOSIS — I10 Essential (primary) hypertension: Secondary | ICD-10-CM | POA: Diagnosis not present

## 2016-09-10 DIAGNOSIS — I6523 Occlusion and stenosis of bilateral carotid arteries: Secondary | ICD-10-CM

## 2016-09-10 DIAGNOSIS — Z951 Presence of aortocoronary bypass graft: Secondary | ICD-10-CM

## 2016-09-10 DIAGNOSIS — I6529 Occlusion and stenosis of unspecified carotid artery: Secondary | ICD-10-CM | POA: Insufficient documentation

## 2016-09-10 NOTE — Progress Notes (Signed)
MRN : [id number]  [name]. is a 83 y.o. (04/18/1926) male who presents with chief complaint of  Chief Complaint  Patient presents with  . Re-evaluation    1 year carotid follow up  .  History of Present Illness: The patient is seen for follow up evaluation of carotid stenosis. The carotid stenosis followed by ultrasound.  The patient denies amaurosis fugax. There is no recent history of TIA symptoms or focal motor deficits. There is no prior documented CVA.  The patient is taking enteric-coated aspirin 81 mg daily.  There is no history of migraine headaches. There is no history of seizures.  The patient has a history of coronary artery disease, no recent episodes of angina or shortness of breath. The patient denies PAD or claudication symptoms. There is a history of hyperlipidemia which is being treated with a statin.  Duplex US of the carotid arteries: RICA and LICA <50% (compared to last years study there has not been any significant change)    Current Meds  Medication Sig  . albuterol (PROVENTIL) (2.5 MG/3ML) 0.083% nebulizer solution Inhale 2.5 mLs into the lungs every 6 (six) hours as needed.  Marland Kitchen aspirin 81 MG tablet Take 81 mg by mouth daily.    Marland Kitchen atorvastatin (LIPITOR) 80 MG tablet Take 1 tablet (80 mg total) by mouth daily.  . budesonide-formoterol (SYMBICORT) 160-4.5 MCG/ACT inhaler Inhale 2 puffs into the lungs 2 (two) times daily.  . carvedilol (COREG) 12.5 MG tablet Take 12.5 mg by mouth 2 (two) times daily with a meal.   . ferrous sulfate 325 (65 FE) MG tablet Take by mouth.  . furosemide (LASIX) 40 MG tablet Take 40 mg by mouth daily.    Marland Kitchen levofloxacin (LEVAQUIN) 500 MG tablet Take 1 tablet (500 mg total) by mouth daily.  Marland Kitchen lisinopril (PRINIVIL,ZESTRIL) 10 MG tablet Take 1 tablet (10 mg total) by mouth daily.  . magnesium oxide (MAG-OX) 400 MG tablet Take 400 mg by mouth daily.  . metroNIDAZOLE (FLAGYL) 500 MG tablet Take 1 tablet (500 mg  total) by mouth 3 (three) times daily.  Marland Kitchen omeprazole (PRILOSEC) 20 MG capsule Take 20 mg by mouth daily.    . potassium chloride (KLOR-CON 10) 10 MEQ tablet Take 10 mEq by mouth daily.  . predniSONE (STERAPRED UNI-PAK 21 TAB) 10 MG (21) TBPK tablet Take 1 tablet (10 mg total) by mouth daily. Please take 6 pills in the morning on the day 1, then taper by one pill daily until finished, thank you    Past Medical History:  Diagnosis Date  . Aortic sclerosis    a. by echo 01/2010 w/ systolic murmur  . CHF (congestive heart failure) (HCC)   . Coronary artery disease    a. 02/2010 s/p CABG x 3 - [place]->LAD, VG->R[place], VG->D1.  Marland Kitchen Hyperlipidemia   . Ischemic cardiomyopathy   . Labile hypertension   . Obesity   . Systolic CHF, chronic (HCC)    a. 01/2010 Echo - EF 35%    Past Surgical History:  Procedure Laterality Date  . APPENDECTOMY    . BACK SURGERY     x 2  . CORONARY ARTERY BYPASS GRAFT  02/14/2010   x 3  . FRACTURE SURGERY    . KNEE SURGERY     bilateral knee surgery  . SHOULDER SURGERY     right    Social History Social History  Substance Use Topics  . Smoking status: Never Smoker  . Smokeless  tobacco: Never Used  . Alcohol use Yes     Comment: "very seldom" alcoholic beverage    Family History Family History  Problem Relation Age of Onset  . Cancer Mother     died @ 4279  . Stroke Father     died @ 8586 or pna  No family history of bleeding/clotting disorders, porphyria or autoimmune disease   Allergies  Allergen Reactions  . Penicillins Rash     REVIEW OF SYSTEMS (Negative unless checked)  Constitutional: [] Weight loss  [] Fever  [] Chills Cardiac: [] Chest pain   [] Chest pressure   [] Palpitations   [] Shortness of breath when laying flat   [] Shortness of breath with exertion. Vascular:  [] Pain in legs with walking   [] Pain in legs at rest  [] History of DVT   [] Phlebitis   [x] Swelling in legs   [] Varicose veins   [] Non-healing ulcers Pulmonary:   [] Uses home  oxygen   [] Productive cough   [] Hemoptysis   [] Wheeze  [] COPD   [] Asthma Neurologic:  [] Dizziness   [] Seizures   [] History of stroke   [] History of TIA  [] Aphasia   [] Vissual changes   [] Weakness or numbness in arm   [] Weakness or numbness in leg Musculoskeletal:   [] Joint swelling   [x] Joint pain   [] Low back pain Hematologic:  [] Easy bruising  [] Easy bleeding   [] Hypercoagulable state   [] Anemic Gastrointestinal:  [] Diarrhea   [] Vomiting  [] Gastroesophageal reflux/heartburn   [] Difficulty swallowing. Genitourinary:  [] Chronic kidney disease   [] Difficult urination  [] Frequent urination   [] Blood in urine Skin:  [] Rashes   [] Ulcers  Psychological:  [] History of anxiety   []  History of major depression.  Physical Examination  Vitals:   09/10/16 0944  BP: 112/66  Pulse: 70  Resp: 16  Weight: 206 lb (93.4 kg)  Height: 5\' 6"  (1.676 m)   Body mass index is 33.25 kg/m. Gen: WD/WN, NAD Head: [place]/AT, No temporalis wasting.  Ear/Nose/Throat: Hearing grossly intact, nares w/o erythema or drainage, poor dentition Eyes: PER, EOMI, sclera nonicteric.  Neck: Supple, no masses.  No bruit or JVD.  Pulmonary:  Good air movement, clear to auscultation bilaterally, no use of accessory muscles.  Cardiac: RRR, normal S1, S2, no Murmurs. Vascular: bilateral carotid bruits noted Vessel Right Left  Radial Palpable Palpable  Ulnar Palpable Palpable  Brachial Palpable Palpable  Carotid Palpable Palpable  Femoral Palpable Palpable  Popliteal Palpable Palpable  PT Palpable Palpable  DP Palpable Palpable   Gastrointestinal: soft, non-distended. No guarding/no peritoneal signs.  Musculoskeletal: M/S 5/5 throughout.  No deformity or atrophy.  Neurologic: CN 2-12 intact. Pain and light touch intact in extremities.  Symmetrical.  Speech is fluent. Motor exam as listed above. Psychiatric: Judgment intact, Mood & affect appropriate for pt's clinical situation. Dermatologic: No rashes or ulcers noted.  No  changes consistent with cellulitis. Lymph : No Cervical lymphadenopathy, no lichenification or skin changes of chronic lymphedema.  CBC Lab Results  Component Value Date   WBC 9.3 08/18/2016   HGB 12.3 (L) 08/18/2016   HCT 36.2 (L) 08/18/2016   MCV 89.7 08/18/2016   PLT 162 08/18/2016    BMET    Component Value Date/Time   NA 135 08/18/2016 1731   K 3.5 08/18/2016 1731   CL 99 (L) 08/18/2016 1731   CO2 28 08/18/2016 1731   GLUCOSE 177 (H) 08/18/2016 1731   BUN 22 (H) 08/18/2016 1731   CREATININE 1.05 08/18/2016 1731   CALCIUM 8.4 (L) 08/18/2016 1731  GFRNONAA >60 08/18/2016 1731   GFRAA >60 08/18/2016 1731   CrCl cannot be calculated (Patient's most recent lab result is older than the maximum 21 days allowed.).  COAG Lab Results  Component Value Date   INR 1.0 05/06/2013   INR 1.56 (H) 02/15/2010   INR 1.07 02/15/2010    Radiology Dg Chest 2 View  Result Date: 08/17/2016 CLINICAL DATA:  Shortness of breath. EXAM: CHEST  2 VIEW COMPARISON:  10/11/2014. FINDINGS: Prior CABG. Cardiomegaly with normal pulmonary vascularity. Mild pleural-parenchymal thickening consistent with scarring again noted. No change. IMPRESSION: 1.  Prior CABG.  Cardiomegaly with normal pulmonary vascularity. 2. Mild bibasilar pleuroparenchymal thickening noted consistent scarring. No change. Electronically Signed   By: Maisie Fus  Register   On: 08/17/2016 16:43   Ct Angio Chest Pe W Or Wo Contrast  Result Date: 08/17/2016 CLINICAL DATA:  Dyspnea, onset yesterday. EXAM: CT ANGIOGRAPHY CHEST WITH CONTRAST TECHNIQUE: Multidetector CT imaging of the chest was performed using the standard protocol during bolus administration of intravenous contrast. Multiplanar CT image reconstructions and MIPs were obtained to evaluate the vascular anatomy. CONTRAST:  75 mL Isovue 370 intravenous COMPARISON:  04/03/2013 FINDINGS: Cardiovascular: Satisfactory opacification of the pulmonary arteries to the segmental level. No  evidence of pulmonary embolism. Stable cardiomegaly. Atherosclerotic calcifications of the aorta and coronary arteries. No pericardial effusion. Mediastinum/Nodes: No enlarged mediastinal, hilar, or axillary lymph nodes. Thyroid gland, trachea, and esophagus demonstrate no significant findings. Lungs/Pleura: Chronic loculated effusion in the right posterior base with adjacent pleuroparenchymal scarring. Milder linear scarring in the left base. This is all unchanged from 2014. No new opacities. Airways are patent and normal in caliber. Upper Abdomen: Hiatal hernia. There is gallbladder mural thickening and pericholecystic edema/ fluid, as well as multiple intraluminal gallbladder calculi. The mural thickening and adjacent edema is new, and is suspicious for cholecystitis. Musculoskeletal: No significant skeletal lesion Review of the MIP images confirms the above findings. IMPRESSION: 1. Negative for pulmonary embolism or other acute vascular abnormality. 2. Chronic loculated pleural collection in the right posterior base with adjacent pleuroparenchymal scarring, unchanged. 3. Gallbladder mural thickening and pericholecystic edema. Cholelithiasis. Findings are concerning for acute cholecystitis. Recommend right upper quadrant ultrasound. 4. Hiatal hernia. 5. These results will be called to the ordering clinician or representative by the Radiologist Assistant, and communication documented in the PACS or zVision Dashboard. Electronically Signed   By: Ellery Plunk M.D.   On: 08/17/2016 23:30   Mr Abdomen Mrcp Wo Contrast  Result Date: 08/18/2016 CLINICAL DATA:  83 year old male with elevated liver function tests. Cholelithiasis and pericholecystic fluid noted on recent CT examination. EXAM: MRI ABDOMEN WITHOUT CONTRAST  (INCLUDING MRCP) TECHNIQUE: Multiplanar multisequence MR imaging of the abdomen was performed. Heavily T2-weighted images of the biliary and pancreatic ducts were obtained, and three-dimensional  MRCP images were rendered by post processing. COMPARISON:  No prior abdominal MRI. Chest CT 08/17/2016. Abdominal ultrasound 08/17/2016. FINDINGS: Comment: Lack of IV gadolinium limits assessment for and characterization of visceral and/or vascular lesions. Lower chest: Small mixed signal intensity fluid collection in the lower right hemithorax, compatible with a complex right-sided pleural effusion which may have some proteinaceous/hemorrhagic contents and appears likely partially loculated. There is some increased signal intensity in the adjacent lung parenchymal, presumably atelectasis. Signal void in the sternum related to median sternotomy wires. Hepatobiliary: No definite cystic or solid hepatic lesions are identified on today's noncontrast examination. No intra or extrahepatic biliary ductal dilatation on MRCP images. Common bile duct measures 5 mm in  the porta hepatis. Several small filling defects lie dependently in the gallbladder, compatible with gallstones. Gallbladder wall appears edematous measuring up to 12 mm in thickness with a trace amount of pericholecystic fluid. Gallbladder is only moderately distended. Pancreas: No pancreatic mass or peripancreatic inflammatory changes are noted on today's noncontrast CT examination. No pancreatic ductal dilatation noted on MRCP images. Spleen:  Unremarkable. Adrenals/Urinary Tract: Multiple T1 hypointense, T2 hyperintense lesions noted in the kidneys bilaterally, incompletely characterized on today's noncontrast examination, but likely to represent cysts, measuring up to 4.8 cm in the upper pole of the right kidney. No hydroureteronephrosis in the visualized portions of the abdomen. Bilateral adrenal glands are normal in appearance. Stomach/Bowel: Visualized portions are unremarkable. Vascular/Lymphatic: No aneurysm identified in the visualized abdominal vasculature. No lymphadenopathy noted in the abdomen. Other: No significant volume of ascites noted in the  visualized portions of the peritoneal cavity. Musculoskeletal: No aggressive osseous lesions are noted in the visualized portions of the skeleton. IMPRESSION: 1. Cholelithiasis with gallbladder wall edema and trace amount of pericholecystic fluid. However, gallbladder is only moderately distended. In the appropriate clinical setting, these findings could be seen in the setting of acute cholecystitis, however, these findings may alternatively simply reflect gallbladder wall edema secondary to biliary dysfunction. Clinical correlation is recommended. 2. Small complex partially loculated right-sided pleural effusion with some associated passive subsegmental atelectasis in the right lower lobe. 3. Additional incidental findings, as above. Electronically Signed   By: Trudie Reed M.D.   On: 08/18/2016 13:25   US Venous Img Lower Bilateral  Result Date: 08/19/2016 CLINICAL DATA:  Bilateral lower extremity swelling for 10 days EXAM: BILATERAL LOWER EXTREMITY VENOUS DOPPLER ULTRASOUND TECHNIQUE: Gray-scale sonography with graded compression, as well as color Doppler and duplex ultrasound were performed to evaluate the lower extremity deep venous systems from the level of the common femoral vein and including the common femoral, femoral, profunda femoral, popliteal and calf veins including the posterior tibial, peroneal and gastrocnemius veins when visible. The superficial great saphenous vein was also interrogated. Spectral Doppler was utilized to evaluate flow at rest and with distal augmentation maneuvers in the common femoral, femoral and popliteal veins. COMPARISON:  None. FINDINGS: RIGHT LOWER EXTREMITY Common Femoral Vein: No evidence of thrombus. Normal compressibility, respiratory phasicity and response to augmentation. Saphenofemoral Junction: No evidence of thrombus. Normal compressibility and flow on color Doppler imaging. Profunda Femoral Vein: No evidence of thrombus. Normal compressibility and flow on  color Doppler imaging. Femoral Vein: No evidence of thrombus. Normal compressibility, respiratory phasicity and response to augmentation. Popliteal Vein: No evidence of thrombus. Normal compressibility, respiratory phasicity and response to augmentation. Calf Veins: No evidence of thrombus. Normal compressibility and flow on color Doppler imaging. Superficial Great Saphenous Vein: No evidence of thrombus. Normal compressibility and flow on color Doppler imaging. Venous Reflux:  None. Other Findings:  None. LEFT LOWER EXTREMITY Common Femoral Vein: No evidence of thrombus. Normal compressibility, respiratory phasicity and response to augmentation. Saphenofemoral Junction: No evidence of thrombus. Normal compressibility and flow on color Doppler imaging. Profunda Femoral Vein: No evidence of thrombus. Normal compressibility and flow on color Doppler imaging. Femoral Vein: No evidence of thrombus. Normal compressibility, respiratory phasicity and response to augmentation. Popliteal Vein: No evidence of thrombus. Normal compressibility, respiratory phasicity and response to augmentation. Calf Veins: No evidence of thrombus. Normal compressibility and flow on color Doppler imaging. Superficial Great Saphenous Vein: No evidence of thrombus. Normal compressibility and flow on color Doppler imaging. Venous Reflux:  None. Other Findings:  None. IMPRESSION: No evidence of deep venous thrombosis. Electronically Signed   By: Judie Petit.  Shick M.D.   On: 08/19/2016 08:35   US Abdomen Limited Ruq  Result Date: 08/18/2016 CLINICAL DATA:  Initial evaluation for elevated LFTs. EXAM: US ABDOMEN LIMITED - RIGHT UPPER QUADRANT COMPARISON:  None available. FINDINGS: Gallbladder: Multiple echogenic stones present within the gallbladder lumen, largest of which measures approximately 8 mm. Gallbladder wall is markedly thickened to 11 mm. Suggestion of edema/fluid within the gallbladder wall as well. No sonographic Murphy sign elicited on exam.  Common bile duct: Diameter: 5.6 mm Liver: No focal lesion identified. Within normal limits in parenchymal echogenicity. Evaluation of the left lobe somewhat limited due to shadowing bowel gas. Probable right pleural effusion noted. Incidental note made of a 3.4 x 4.1 x 2.9 cm anechoic right renal cyst. IMPRESSION: 1. Cholelithiasis with marked gallbladder wall thickening up to 11 mm. Clinical correlation for possible acute cholecystitis recommended. No biliary dilatation. 2. Right pleural effusion. 3. 3.4 x 4.1 x 2.9 cm right renal cyst. Electronically Signed   By: Rise Mu M.D.   On: 08/18/2016 00:21    Assessment/Plan 1. Bilateral carotid artery stenosis Recommend:  Given the patient's asymptomatic subcritical stenosis no further invasive testing or surgery at this time.  Duplex ultrasound shows <50% stenosis bilaterally which has been unchanged when compared to the previous studies.  Continue antiplatelet therapy as prescribed Continue management of CAD, HTN and Hyperlipidemia Healthy heart diet,  encouraged exercise at least 4 times per week  Given the stable <50% bilateral carotid stenosis in association with the patient's age the patient will follow up PRN.  The patient is told that if symptoms of a TIA should occur then he should go to the ER and I should be notified, as this would change the management course.  The patient voices understanding.  2. CORONARY ARTERY BYPASS GRAFT, THREE VESSEL, HX OF Continue cardiac and antihypertensive medications as already ordered and reviewed, no changes at this time.  Continue statin as ordered and reviewed, no changes at this time  Nitrates PRN for chest pain  3. HYPERTENSION, BENIGN Continue antihypertensive medications as already ordered, these medications have been reviewed and there are no changes at this time.     Levora Dredge, MD  09/10/2016 11:09 AM

## 2018-12-23 ENCOUNTER — Emergency Department: Payer: Medicare Other

## 2018-12-23 ENCOUNTER — Encounter: Payer: Self-pay | Admitting: *Deleted

## 2018-12-23 ENCOUNTER — Observation Stay
Admission: EM | Admit: 2018-12-23 | Discharge: 2018-12-24 | Disposition: A | Discharge status: Skilled Nursing Facility | Payer: Medicare Other | Attending: Internal Medicine | Admitting: Internal Medicine

## 2018-12-23 ENCOUNTER — Other Ambulatory Visit: Payer: Self-pay

## 2018-12-23 DIAGNOSIS — Z6837 Body mass index (BMI) 37.0-37.9, adult: Secondary | ICD-10-CM | POA: Diagnosis not present

## 2018-12-23 DIAGNOSIS — I5043 Acute on chronic combined systolic (congestive) and diastolic (congestive) heart failure: Secondary | ICD-10-CM | POA: Insufficient documentation

## 2018-12-23 DIAGNOSIS — Z79899 Other long term (current) drug therapy: Secondary | ICD-10-CM | POA: Insufficient documentation

## 2018-12-23 DIAGNOSIS — W19XXXA Unspecified fall, initial encounter: Secondary | ICD-10-CM

## 2018-12-23 DIAGNOSIS — Z7982 Long term (current) use of aspirin: Secondary | ICD-10-CM | POA: Insufficient documentation

## 2018-12-23 DIAGNOSIS — Z96611 Presence of right artificial shoulder joint: Secondary | ICD-10-CM | POA: Insufficient documentation

## 2018-12-23 DIAGNOSIS — Z79890 Hormone replacement therapy (postmenopausal): Secondary | ICD-10-CM | POA: Diagnosis not present

## 2018-12-23 DIAGNOSIS — I255 Ischemic cardiomyopathy: Secondary | ICD-10-CM | POA: Diagnosis not present

## 2018-12-23 DIAGNOSIS — I4510 Unspecified right bundle-branch block: Secondary | ICD-10-CM | POA: Diagnosis not present

## 2018-12-23 DIAGNOSIS — Z96651 Presence of right artificial knee joint: Secondary | ICD-10-CM | POA: Diagnosis not present

## 2018-12-23 DIAGNOSIS — Z951 Presence of aortocoronary bypass graft: Secondary | ICD-10-CM | POA: Insufficient documentation

## 2018-12-23 DIAGNOSIS — Z7951 Long term (current) use of inhaled steroids: Secondary | ICD-10-CM | POA: Diagnosis not present

## 2018-12-23 DIAGNOSIS — I700 Atherosclerosis of aorta: Secondary | ICD-10-CM | POA: Insufficient documentation

## 2018-12-23 DIAGNOSIS — Z1159 Encounter for screening for other viral diseases: Secondary | ICD-10-CM | POA: Insufficient documentation

## 2018-12-23 DIAGNOSIS — E785 Hyperlipidemia, unspecified: Secondary | ICD-10-CM | POA: Diagnosis not present

## 2018-12-23 DIAGNOSIS — W010XXA Fall on same level from slipping, tripping and stumbling without subsequent striking against object, initial encounter: Secondary | ICD-10-CM | POA: Diagnosis not present

## 2018-12-23 DIAGNOSIS — S32301A Unspecified fracture of right ilium, initial encounter for closed fracture: Principal | ICD-10-CM | POA: Insufficient documentation

## 2018-12-23 DIAGNOSIS — Y92009 Unspecified place in unspecified non-institutional (private) residence as the place of occurrence of the external cause: Secondary | ICD-10-CM | POA: Insufficient documentation

## 2018-12-23 DIAGNOSIS — I2510 Atherosclerotic heart disease of native coronary artery without angina pectoris: Secondary | ICD-10-CM | POA: Insufficient documentation

## 2018-12-23 DIAGNOSIS — S0990XA Unspecified injury of head, initial encounter: Secondary | ICD-10-CM | POA: Diagnosis not present

## 2018-12-23 DIAGNOSIS — J449 Chronic obstructive pulmonary disease, unspecified: Secondary | ICD-10-CM | POA: Diagnosis not present

## 2018-12-23 DIAGNOSIS — S32491A Other specified fracture of right acetabulum, initial encounter for closed fracture: Secondary | ICD-10-CM | POA: Insufficient documentation

## 2018-12-23 DIAGNOSIS — R748 Abnormal levels of other serum enzymes: Secondary | ICD-10-CM | POA: Insufficient documentation

## 2018-12-23 DIAGNOSIS — S329XXA Fracture of unspecified parts of lumbosacral spine and pelvis, initial encounter for closed fracture: Secondary | ICD-10-CM | POA: Diagnosis present

## 2018-12-23 DIAGNOSIS — I110 Hypertensive heart disease with heart failure: Secondary | ICD-10-CM | POA: Insufficient documentation

## 2018-12-23 DIAGNOSIS — Z66 Do not resuscitate: Secondary | ICD-10-CM | POA: Diagnosis not present

## 2018-12-23 DIAGNOSIS — E669 Obesity, unspecified: Secondary | ICD-10-CM | POA: Insufficient documentation

## 2018-12-23 LAB — COMPREHENSIVE METABOLIC PANEL
ALT: 17 U/L (ref 0–44)
AST: 22 U/L (ref 15–41)
Albumin: 4 g/dL (ref 3.5–5.0)
Alkaline Phosphatase: 57 U/L (ref 38–126)
Anion gap: 9 (ref 5–15)
BUN: 34 mg/dL — ABNORMAL HIGH (ref 8–23)
CO2: 29 mmol/L (ref 22–32)
Calcium: 9.2 mg/dL (ref 8.9–10.3)
Chloride: 104 mmol/L (ref 98–111)
Creatinine, Ser: 1.14 mg/dL (ref 0.61–1.24)
GFR calc Af Amer: >60 mL/min (ref >60)
GFR calc non Af Amer: 56 mL/min — ABNORMAL LOW (ref >60)
Glucose, Bld: 146 mg/dL — ABNORMAL HIGH (ref 70–99)
Potassium: 4.2 mmol/L (ref 3.5–5.1)
Sodium: 142 mmol/L (ref 135–145)
Total Bilirubin: 1.1 mg/dL (ref 0.3–1.2)
Total Protein: 6.7 g/dL (ref 6.5–8.1)

## 2018-12-23 LAB — CBC
HCT: 37 % — ABNORMAL LOW (ref 39.0–52.0)
Hemoglobin: 12.1 g/dL — ABNORMAL LOW (ref 13.0–17.0)
MCH: 30.9 pg (ref 26.0–34.0)
MCHC: 32.7 g/dL (ref 30.0–36.0)
MCV: 94.4 fL (ref 80.0–100.0)
Platelets: 166 10*3/uL (ref 150–400)
RBC: 3.92 MIL/uL — ABNORMAL LOW (ref 4.22–5.81)
RDW: 13.2 % (ref 11.5–15.5)
WBC: 10.7 10*3/uL — ABNORMAL HIGH (ref 4.0–10.5)
nRBC: 0 % (ref 0.0–0.2)

## 2018-12-23 LAB — PROTIME-INR
INR: 1.1 (ref 0.8–1.2)
Prothrombin Time: 14.3 seconds (ref 11.4–15.2)

## 2018-12-23 LAB — CK: Total CK: 173 U/L (ref 49–397)

## 2018-12-23 LAB — SARS CORONAVIRUS 2 BY RT PCR (HOSPITAL ORDER, PERFORMED IN ~~LOC~~ HOSPITAL LAB): SARS Coronavirus 2: NEGATIVE

## 2018-12-23 MED ORDER — POLYETHYLENE GLYCOL 3350 17 G PO PACK: 17.0000 g | PACK | Freq: Every day | ORAL | Status: DC | PRN

## 2018-12-23 MED ORDER — MAGNESIUM OXIDE 400 (241.3 MG) MG PO TABS
400.0000 mg | ORAL_TABLET | Freq: Every day | ORAL | Status: DC
  Administered 2018-12-24: 10:00:00 400 mg via ORAL
  Filled 2018-12-23: qty 1

## 2018-12-23 MED ORDER — FERROUS SULFATE 325 (65 FE) MG PO TABS
325.0000 mg | ORAL_TABLET | Freq: Every day | ORAL | Status: DC
  Administered 2018-12-24: 325 mg via ORAL
  Filled 2018-12-23: qty 1

## 2018-12-23 MED ORDER — OXYCODONE HCL 5 MG PO TABS
5.0000 mg | ORAL_TABLET | ORAL | Status: DC | PRN
  Administered 2018-12-23 – 2018-12-24 (×3): 5 mg via ORAL
  Filled 2018-12-23 (×3): qty 1

## 2018-12-23 MED ORDER — CARVEDILOL 3.125 MG PO TABS
12.5000 mg | ORAL_TABLET | Freq: Two times a day (BID) | ORAL | Status: DC
  Administered 2018-12-23 – 2018-12-24 (×2): 12.5 mg via ORAL
  Filled 2018-12-23: qty 2
  Filled 2018-12-23: qty 4

## 2018-12-23 MED ORDER — HEPARIN SODIUM (PORCINE) 5000 UNIT/ML IJ SOLN
5000.0000 [IU] | Freq: Three times a day (TID) | INTRAMUSCULAR | Status: DC
  Administered 2018-12-23 – 2018-12-24 (×3): 5000 [IU] via SUBCUTANEOUS
  Filled 2018-12-23 (×3): qty 1

## 2018-12-23 MED ORDER — PANTOPRAZOLE SODIUM 40 MG PO TBEC
40.0000 mg | DELAYED_RELEASE_TABLET | Freq: Every day | ORAL | Status: DC
  Administered 2018-12-24: 40 mg via ORAL
  Filled 2018-12-23: qty 1

## 2018-12-23 MED ORDER — DOCUSATE SODIUM 100 MG PO CAPS
100.0000 mg | ORAL_CAPSULE | Freq: Two times a day (BID) | ORAL | Status: DC
  Administered 2018-12-23: 100 mg via ORAL
  Filled 2018-12-23 (×2): qty 1

## 2018-12-23 MED ORDER — ONDANSETRON HCL 4 MG PO TABS: 4.0000 mg | ORAL_TABLET | Freq: Four times a day (QID) | ORAL | Status: DC | PRN

## 2018-12-23 MED ORDER — SPIRONOLACTONE 25 MG PO TABS
25.0000 mg | ORAL_TABLET | Freq: Every day | ORAL | Status: DC
  Administered 2018-12-24: 25 mg via ORAL
  Filled 2018-12-23: qty 1

## 2018-12-23 MED ORDER — ACETAMINOPHEN 650 MG RE SUPP: 650.0000 mg | Freq: Four times a day (QID) | RECTAL | Status: DC | PRN

## 2018-12-23 MED ORDER — ALBUTEROL SULFATE (2.5 MG/3ML) 0.083% IN NEBU: 2.5000 mg | INHALATION_SOLUTION | Freq: Four times a day (QID) | RESPIRATORY_TRACT | Status: DC | PRN

## 2018-12-23 MED ORDER — HYDRALAZINE HCL 20 MG/ML IJ SOLN
INTRAMUSCULAR | Status: AC
  Administered 2018-12-23: 10 mg via INTRAVENOUS
  Filled 2018-12-23: qty 1

## 2018-12-23 MED ORDER — POTASSIUM CHLORIDE CRYS ER 10 MEQ PO TBCR
10.0000 meq | EXTENDED_RELEASE_TABLET | Freq: Every day | ORAL | Status: DC
  Administered 2018-12-24: 10:00:00 10 meq via ORAL
  Filled 2018-12-23: qty 1

## 2018-12-23 MED ORDER — FUROSEMIDE 20 MG PO TABS
20.0000 mg | ORAL_TABLET | Freq: Every day | ORAL | Status: DC
  Administered 2018-12-24: 20 mg via ORAL
  Filled 2018-12-23: qty 1

## 2018-12-23 MED ORDER — HYDRALAZINE HCL 20 MG/ML IJ SOLN
10.0000 mg | Freq: Once | INTRAMUSCULAR | Status: AC
  Administered 2018-12-23: 10 mg via INTRAVENOUS

## 2018-12-23 MED ORDER — ATORVASTATIN CALCIUM 20 MG PO TABS
80.0000 mg | ORAL_TABLET | Freq: Every day | ORAL | Status: DC
  Administered 2018-12-24: 80 mg via ORAL
  Filled 2018-12-23: qty 4

## 2018-12-23 MED ORDER — ACETAMINOPHEN 325 MG PO TABS: 650.0000 mg | ORAL_TABLET | Freq: Four times a day (QID) | ORAL | Status: DC | PRN

## 2018-12-23 MED ORDER — VITAMIN B-12 1000 MCG PO TABS
1000.0000 ug | ORAL_TABLET | Freq: Every day | ORAL | Status: DC
  Administered 2018-12-24: 10:00:00 1000 ug via ORAL
  Filled 2018-12-23: qty 1

## 2018-12-23 MED ORDER — ONDANSETRON HCL 4 MG/2ML IJ SOLN: 4.0000 mg | Freq: Four times a day (QID) | INTRAMUSCULAR | Status: DC | PRN

## 2018-12-23 MED ORDER — ASPIRIN EC 81 MG PO TBEC
81.0000 mg | DELAYED_RELEASE_TABLET | Freq: Every day | ORAL | Status: DC
  Administered 2018-12-24: 81 mg via ORAL
  Filled 2018-12-23: qty 1

## 2018-12-23 MED ORDER — LOSARTAN POTASSIUM 50 MG PO TABS
50.0000 mg | ORAL_TABLET | Freq: Every day | ORAL | Status: DC
  Administered 2018-12-23: 50 mg via ORAL
  Filled 2018-12-23 (×2): qty 1

## 2018-12-23 NOTE — ED Notes (Signed)
ED TO INPATIENT HANDOFF REPORT  ED Nurse Name and Phone #: [phone]   S Name/Age/Gender [name]. 85 y.o. male Room/Bed: ED18A/ED18A  Code Status   Code Status: Prior  Home/SNF/Other Home Patient oriented to: self, place, time and situation Is this baseline? Yes   Triage Complete: Triage complete  Chief Complaint Fall ems  Triage Note Pt to ED reporting a fall last night. Pt denies tripping but started to lose his balance and fell in the bathroom. Pt hit his head but denies pain at this time or LOC. Pt remained on ground for extended period of time but is unsure exactly how long. Right hip and thigh pain currently with decreased mobility. No shortening or rotation noted. Pedal pulses strong and equal bilaterally and skin color is appropriate. Pt is alert and oriented x 4. Pt has hx of falls and uses a walker in the house.    Allergies Allergies  Allergen Reactions  . Penicillins Rash    Level of Care/Admitting Diagnosis ED Disposition    ED Disposition Condition Comment   Admit  Hospital Area: [facility] [[id number]]  Level of Care: Med-Surg [16]  Covid Evaluation: N/A  Diagnosis: Pelvic fracture (HCC) [454098]  Admitting Physician: Joselyn Glassman  Attending Physician: Joselyn Glassman  Estimated length of stay: past midnight tomorrow  Certification:: I certify this patient will need inpatient services for at least 2 midnights  PT Class (Do Not Modify): Inpatient [101]  PT Acc Code (Do Not Modify): Private [1]       B Medical/Surgery History Past Medical History:  Diagnosis Date  . Aortic sclerosis    a. by echo 01/2010 w/ systolic murmur  . CHF (congestive heart failure) (HCC)   . Coronary artery disease    a. 02/2010 s/p CABG x 3 - Lima->LAD, VG->Ramus, VG->D1.  Marland Kitchen Hyperlipidemia   . Ischemic cardiomyopathy   . Labile hypertension   . Obesity   . Systolic CHF, chronic (HCC)    a. 01/2010 Echo - EF 35%   Past  Surgical History:  Procedure Laterality Date  . APPENDECTOMY    . BACK SURGERY     x 2  . CORONARY ARTERY BYPASS GRAFT  02/14/2010   x 3  . FRACTURE SURGERY    . KNEE SURGERY     bilateral knee surgery  . SHOULDER SURGERY     right     A IV Location/Drains/Wounds Patient Lines/Drains/Airways Status   Active Line/Drains/Airways    Name:   Placement date:   Placement time:   Site:   Days:   Peripheral IV 12/23/18 Right Antecubital   12/23/18    1038    Antecubital   less than 1          Intake/Output Last 24 hours No intake or output data in the 24 hours ending 12/23/18 1342  Labs/Imaging Results for orders placed or performed during the hospital encounter of 12/23/18 (from the past 48 hour(s))  CBC     Status: Abnormal   Collection Time: 12/23/18  9:58 AM  Result Value Ref Range   WBC 10.7 (H) 4.0 - 10.5 K/uL   RBC 3.92 (L) 4.22 - 5.81 MIL/uL   Hemoglobin 12.1 (L) 13.0 - 17.0 g/dL   HCT 11.9 (L) 14.7 - 82.9 %   MCV 94.4 80.0 - 100.0 fL   MCH 30.9 26.0 - 34.0 pg   MCHC 32.7 30.0 - 36.0 g/dL   RDW 56.2 13.0 -  15.5 %   Platelets 166 150 - 400 K/uL   nRBC 0.0 0.0 - 0.2 %    Comment: Performed at [facility], [address] [place], [place] [address]  Comprehensive metabolic panel     Status: Abnormal   Collection Time: 12/23/18  9:58 AM  Result Value Ref Range   Sodium 142 135 - 145 mmol/L   Potassium 4.2 3.5 - 5.1 mmol/L   Chloride 104 98 - 111 mmol/L   CO2 29 22 - 32 mmol/L   Glucose, Bld 146 (H) 70 - 99 mg/dL   BUN 34 (H) 8 - 23 mg/dL   Creatinine, Ser 2.68 0.61 - 1.24 mg/dL   Calcium 9.2 8.9 - 34.1 mg/dL   Total Protein 6.7 6.5 - 8.1 g/dL   Albumin 4.0 3.5 - 5.0 g/dL   AST 22 15 - 41 U/L   ALT 17 0 - 44 U/L   Alkaline Phosphatase 57 38 - 126 U/L   Total Bilirubin 1.1 0.3 - 1.2 mg/dL   GFR calc non Af Amer 56 (L) >60 mL/min   GFR calc Af Amer >60 >60 mL/min   Anion gap 9 5 - 15    Comment: Performed at [facility], [address] [place], [place] [address]  Protime-INR     Status: None   Collection Time: 12/23/18  9:58 AM  Result Value Ref Range   Prothrombin Time 14.3 11.4 - 15.2 seconds   INR 1.1 0.8 - 1.2    Comment: (NOTE) INR goal varies based on device and disease states. Performed at [facility], [address] [place], [place] [address]   CK     Status: None   Collection Time: 12/23/18  9:58 AM  Result Value Ref Range   Total CK 173 49 - 397 U/L    Comment: Performed at [facility], [address] [place] [place], [place] [address]   Dg Chest 1 View  Result Date: 12/23/2018 CLINICAL DATA:  Fall.  Preop EXAM: CHEST  1 VIEW COMPARISON:  08/17/2016 FINDINGS: CABG changes. Negative for heart failure. Small right pleural effusion or scarring is unchanged. Mild right lower lobe atelectasis or scarring is unchanged. Left lung clear Right shoulder replacement IMPRESSION: Chronic changes right lung base are stable from the prior study. No superimposed acute abnormality. Electronically Signed   By: Marlan Palau M.D.   On: 12/23/2018 10:26   Ct Head Wo Contrast  Result Date: 12/23/2018 CLINICAL DATA:  Head trauma secondary to a fall in the bathroom last night. EXAM: CT HEAD WITHOUT CONTRAST TECHNIQUE: Contiguous axial images were obtained from the base of the skull through the vertex without intravenous contrast. COMPARISON:  None. FINDINGS: Brain: There is no acute hemorrhage or infarction. There is diffuse cerebral cortical atrophy with secondary ventricular dilatation. There is fairly extensive periventricular white matter lucency consistent with chronic small vessel ischemic changes. Vascular: No hyperdense vessel or unexpected calcification. Skull: Normal. Negative for fracture or focal lesion. Sinuses/Orbits: No significant abnormalities. Other: None. IMPRESSION: 1. No apparent acute intracranial abnormality. 2. Moderate cerebral cortical atrophy. 3. Extensive periventricular white matter disease  most likely small vessel ischemic change. Electronically Signed   By: Francene Boyers M.D.   On: 12/23/2018 10:19   Ct Femur Right Wo Contrast  Result Date: 12/23/2018 CLINICAL DATA:  Right hip and thigh pain since a fall last night. Previous mid right femur fracture treated with open reduction and internal fixation. EXAM: CT OF THE LOWER  RIGHT EXTREMITY WITHOUT CONTRAST TECHNIQUE: Multidetector CT imaging of the right lower extremity was performed according to the standard protocol. COMPARISON:  None. FINDINGS: Bones/Joint/Cartilage There is a fracture of right iliac bone which extends inferiorly into the superior and medial and anterior aspects of the right acetabulum without significant displacement. The fracture extends into the base of the right superior pubic ramus. The inferior pubic ramus and right pubic body are intact. The right femur is intact. Old healed fractures of the midshaft of the right femur. Right total knee prosthesis in place. Intramedullary rod in the right femur. Muscles and Tendons No acute abnormalities. Atrophy of the vastus lateralis and vastus intermedius muscles. Slight atrophy of the right gluteal muscles. Soft tissues There is soft tissue stranding around the anterior aspect of the right acetabulum and around the right superior pubic ramus consistent with hemorrhage from the acetabular fracture. There is also prominence in the region of the right internal obturator muscle consistent with hemorrhage in that area. IMPRESSION: 1. Fracture of the right iliac bone and right acetabulum as described. 2. Slight hemorrhage into the adjacent soft tissues as described. 3. No acute abnormality of the right femur. Electronically Signed   By: Francene BoyersJames  Maxwell M.D.   On: 12/23/2018 11:48   Dg Hip Unilat With Pelvis 2-3 Views Right  Result Date: 12/23/2018 CLINICAL DATA:  Fall EXAM: DG HIP (WITH OR WITHOUT PELVIS) 2-3V RIGHT COMPARISON:  None. FINDINGS: Negative for acute fracture Chronic healed  fracture of the mid femur with intramedullary rod in good position. Right hip joint normal Lumbar hardware fusion with rods and screws. Total knee replacement on the right. Bone graft donor site right iliac crest. IMPRESSION: No acute fracture. Electronically Signed   By: Marlan Palauharles  Clark M.D.   On: 12/23/2018 10:25    Pending Labs Unresulted Labs (From admission, onward)    Start     Ordered   Signed and Held  CBC  (heparin)  Once,   R    Comments:  Baseline for heparin therapy IF NOT ALREADY DRAWN.  Notify MD if PLT < 100 K.    Signed and Held   Signed and Held  Creatinine, serum  (heparin)  Once,   R    Comments:  Baseline for heparin therapy IF NOT ALREADY DRAWN.    Signed and Held          Vitals/Pain Today's Vitals   12/23/18 0926 12/23/18 0930 12/23/18 1037 12/23/18 1230  BP:  (!) 146/72 139/65 (!) 165/74  Pulse:  64 66 67  Resp:  (!) 21 18 20   Temp:      TempSrc:      SpO2:  92% 92% 91%  Weight: 88.5 kg     Height: 5\' 7"  (1.702 m)     PainSc: 5        Isolation Precautions No active isolations  Medications Medications - No data to display  Mobility Ambulates with a cane and walker at baseline but with this admission is nonambulatory.  High fall risk   Focused Assessments other   R Recommendations: See Admitting Provider Note  Report given to:   Additional Notes:

## 2018-12-23 NOTE — Consult Note (Signed)
Called by ER attending, Dr. Minna Antis regarding this 85 year old patient who sustained a fall at home today.  Patient is noted by plain x-ray and CT scan to have a fracture of his ileum which extends into the acetabulum and base of the superior pubic ramus which are nondisplaced.  The right hip joint is located.  Patient has previous lumbar instrumented fusion and the intramedullary rod for previous femur fracture as well as bilateral total knees seen on the CT scan.  There is no evidence of hip orfemur fracture.  Patient will not require surgical intervention for his pelvic fracture.  I recommend toe-touch weightbearing on the right lower extremity.  Patient may follow-up in my office in 2 weeks for reevaluation and x-ray.  Patient may require placement in a skilled nursing facility given that he is 85 years old and will have toe-touch weightbearing restrictions.

## 2018-12-23 NOTE — ED Provider Notes (Signed)
[facility] Emergency Department Provider Note  Time seen: 9:44 AM  I have reviewed the triage vital signs and the nursing notes.   HISTORY  Chief Complaint Fall and Hip Pain   HPI Glenn Easter Tywuan Takushi. is a 85 y.o. male with a past medical history of CHF, CAD, hyperlipidemia, presents to the emergency department after a fall.   According to the patient he slipped last night falling onto his right hip.  Patient did hit his head but denies LOC.  Patient states he was on the ground for quite some time before he was able to get off the ground.  Patient's only complaint at this time is pain in the right hip, worse with movement.  Denies any headache.  No neck pain or back pain.   Past Medical History:  Diagnosis Date  . Aortic sclerosis    a. by echo 01/2010 w/ systolic murmur  . CHF (congestive heart failure) (HCC)   . Coronary artery disease    a. 02/2010 s/p CABG x 3 - Lima->LAD, VG->Ramus, VG->D1.  Marland Kitchen Hyperlipidemia   . Ischemic cardiomyopathy   . Labile hypertension   . Obesity   . Systolic CHF, chronic (HCC)    a. 01/2010 Echo - EF 35%    Patient Active Problem List   Diagnosis Date Noted  . Carotid stenosis 09/10/2016  . Acute on chronic respiratory failure with hypoxia (HCC) 08/20/2016  . COPD exacerbation (HCC) 08/20/2016  . Cholecystitis, acute 08/20/2016  . Cholelithiasis 08/20/2016  . Elevated transaminase level 08/20/2016  . Elevated troponin 08/20/2016  . Anemia 08/20/2016  . Essential hypertension, malignant 08/20/2016  . Acute on chronic systolic CHF (congestive heart failure) (HCC) 08/18/2016  . Symptomatic cholelithiasis   . Elevated liver enzymes   . Ischemic cardiomyopathy   . Systolic CHF, chronic (HCC)   . Aortic sclerosis   . HYPERTENSION, BENIGN 05/23/2010  . CORONARY ARTERY BYPASS GRAFT, THREE VESSEL, HX OF 03/09/2010  . Hyperlipidemia 02/09/2010  . EDEMA 02/02/2010  . SHORTNESS OF BREATH 02/02/2010    Past Surgical  History:  Procedure Laterality Date  . APPENDECTOMY    . BACK SURGERY     x 2  . CORONARY ARTERY BYPASS GRAFT  02/14/2010   x 3  . FRACTURE SURGERY    . KNEE SURGERY     bilateral knee surgery  . SHOULDER SURGERY     right    Prior to Admission medications   Medication Sig Start Date End Date Taking? Authorizing Provider  albuterol (PROVENTIL) (2.5 MG/3ML) 0.083% nebulizer solution Inhale 2.5 mLs into the lungs every 6 (six) hours as needed.    [provider]  aspirin 81 MG tablet Take 81 mg by mouth daily.      [provider]  atorvastatin (LIPITOR) 80 MG tablet Take 1 tablet (80 mg total) by mouth daily. 02/28/11   Antonieta Iba, MD  budesonide-formoterol (SYMBICORT) 160-4.5 MCG/ACT inhaler Inhale 2 puffs into the lungs 2 (two) times daily. 08/20/16   Katharina Caper, MD  carvedilol (COREG) 12.5 MG tablet Take 12.5 mg by mouth 2 (two) times daily with a meal.     [provider]  ferrous sulfate 325 (65 FE) MG tablet Take by mouth.    [provider]  furosemide (LASIX) 40 MG tablet Take 40 mg by mouth daily.      [provider]  levofloxacin (LEVAQUIN) 500 MG tablet Take 1 tablet (500 mg total) by mouth daily. 08/20/16  Katharina CaperVaickute, Rima, MD  lisinopril (PRINIVIL,ZESTRIL) 10 MG tablet Take 1 tablet (10 mg total) by mouth daily. 08/20/16   Katharina CaperVaickute, Rima, MD  magnesium oxide (MAG-OX) 400 MG tablet Take 400 mg by mouth daily.    [provider]  metroNIDAZOLE (FLAGYL) 500 MG tablet Take 1 tablet (500 mg total) by mouth 3 (three) times daily. 08/20/16   Katharina CaperVaickute, Rima, MD  omeprazole (PRILOSEC) 20 MG capsule Take 20 mg by mouth daily.      [provider]  potassium chloride (KLOR-CON 10) 10 MEQ tablet Take 10 mEq by mouth daily. 02/25/15   [provider]  predniSONE (STERAPRED UNI-PAK 21 TAB) 10 MG (21) TBPK tablet Take 1 tablet (10 mg total) by mouth daily. Please take 6 pills in the morning on the day 1, then taper by  one pill daily until finished, thank you 08/20/16   Katharina CaperVaickute, Rima, MD  pravastatin (PRAVACHOL) 40 MG tablet Take 1 tablet (40 mg total) by mouth every evening. 07/23/11 11/06/11  Antonieta IbaGollan, Timothy J, MD    Allergies  Allergen Reactions  . Penicillins Rash    Family History  Problem Relation Age of Onset  . Cancer Mother        died @ 10679  . Stroke Father        died @ 7486 or pna    Social History Social History   Tobacco Use  . Smoking status: Never Smoker  . Smokeless tobacco: Never Used  Substance Use Topics  . Alcohol use: Yes    Comment: "very seldom" alcoholic beverage  . Drug use: No    Review of Systems Constitutional: Negative for loss of consciousness Cardiovascular: Negative for chest pain. Respiratory: Negative for shortness of breath. Gastrointestinal: Negative for abdominal pain Musculoskeletal: Right hip pain Skin: Negative for skin complaints  Neurological: Negative for headache All other ROS negative  ____________________________________________   PHYSICAL EXAM:  VITAL SIGNS: ED Triage Vitals  Enc Vitals Group     BP 12/23/18 0925 (!) 169/77     Pulse Rate 12/23/18 0925 71     Resp 12/23/18 0925 20     Temp 12/23/18 0925 98.2 F (36.8 C)     Temp Source 12/23/18 0925 Oral     SpO2 12/23/18 0925 94 %     Weight 12/23/18 0926 195 lb (88.5 kg)     Height 12/23/18 0926 5\' 7"  (1.702 m)     Head Circumference --      Peak Flow --      Pain Score 12/23/18 0926 5     Pain Loc --      Pain Edu? --      Excl. in GC? --    Constitutional: Alert and oriented. Well appearing and in no distress. Eyes: Normal exam ENT      Head: Normocephalic and atraumatic.      Mouth/Throat: Mucous membranes are moist. Cardiovascular: Normal rate, regular rhythm Respiratory: Normal respiratory effort without tachypnea nor retractions. Breath sounds are clear Gastrointestinal: Soft and nontender. No distention.  Musculoskeletal: Mild tenderness palpation of the right  hip, moderate pain with range of motion of the right hip.  Neurovascular intact distally.  All other extremities have great range of motion without pain. Neurologic:  Normal speech and language. No gross focal neurologic deficits  Skin:  Skin is warm, dry and intact.  Psychiatric: Mood and affect are normal.  ____________________________________________    EKG  EKG viewed and interpreted by myself shows a normal  sinus rhythm at 63 bpm with a narrow QRS, normal axis, largely normal intervals, right bundle branch block.  Nonspecific ST changes.  ____________________________________________    RADIOLOGY  X-ray negative for acute fracture. CT scan shows fracture of the right iliac and acetabulum. Chest x-ray shows chronic changes without acute abnormality. CT head negative  ____________________________________________   INITIAL IMPRESSION / ASSESSMENT AND PLAN / ED COURSE  Pertinent labs & imaging results that were available during my care of the patient were reviewed by me and considered in my medical decision making (see chart for details).   Patient presents emergency department after mechanical fall overnight.  Patient is complaining of right hip pain.  Patient states he does take anticoagulation we will obtain a CT head as a precaution.  We will obtain x-ray imaging of the right hip and chest as well as basic lab work.  Patient agreeable to plan of care.  Patient's x-ray initially negative.  Proceeded with CT scan which shows right iliac and acetabular fracture.  Patient's lab work is largely nonrevealing.  I discussed the patient with Dr. Martha Clan of orthopedics.  States that is nonoperative but nonweightbearing.  Patient will likely require admission for pain control and placement into a rehab facility.  Patient agreeable to plan of care.  ____________________________________________   FINAL CLINICAL IMPRESSION(S) / ED DIAGNOSES  Right hip pain Fall Right iliac/acetabulum  fracture   Minna Antis, MD 12/23/18 1255

## 2018-12-23 NOTE — ED Notes (Signed)
Patient transported to CT 

## 2018-12-23 NOTE — Progress Notes (Signed)
Family Meeting Note  Advance Directive: yes  She has history of combined systolic/diastolic heart failure, hypertension, hyperlipidemia. Being admitted for pelvic fracture. Had a mechanical fall at home. Discuss code status patient states he wishes to be a DNR.  Orthopedic consultation and social worker consultation placed. Time spent 16 minutes Enedina Finner, MD

## 2018-12-23 NOTE — ED Triage Notes (Signed)
Pt to ED reporting a fall last night. Pt denies tripping but started to lose his balance and fell in the bathroom. Pt hit his head but denies pain at this time or LOC. Pt remained on ground for extended period of time but is unsure exactly how long. Right hip and thigh pain currently with decreased mobility. No shortening or rotation noted. Pedal pulses strong and equal bilaterally and skin color is appropriate. Pt is alert and oriented x 4. Pt has hx of falls and uses a walker in the house.

## 2018-12-23 NOTE — H&P (Signed)
[facility] - [place] at [facility]   PATIENT NAME: [name]    MR#:  [id number]  DATE OF BIRTH:  18-Feb-1926  DATE OF ADMISSION:  12/23/2018  PRIMARY CARE PHYSICIAN: Dortha Kern, MD   REQUESTING/REFERRING PHYSICIAN: Dr. Lenard Lance  CHIEF COMPLAINT:   Fall last night pain on the right hip HISTORY OF PRESENT ILLNESS:  Sherill Wegener  is a 85 y.o. male with a known history of combined systolic/diastolic congestive heart failure, hyperlipidemia, aortic sclerosis comes to the emergency room after he tripped and fell last night. Patient says he did not hit his head denies loss of consciousness. He has had a few falls at home. Uses a cane for walking. He came in this morning because of right hip pain worse with movement. Denies any headache neck pain or back pain. In the emergency room patient was found to have pelvic fracture.  ER physician spoke with Dr. Kirtland Bouchard. Conservative management recommended. Patient is being admitted for further evaluation management.    PAST MEDICAL HISTORY:   Past Medical History:  Diagnosis Date  . Aortic sclerosis    a. by echo 01/2010 w/ systolic murmur  . CHF (congestive heart failure) (HCC)   . Coronary artery disease    a. 02/2010 s/p CABG x 3 - Lima->LAD, VG->Ramus, VG->D1.  Thomas Kitchen Hyperlipidemia   . Ischemic cardiomyopathy   . Labile hypertension   . Obesity   . Systolic CHF, chronic (HCC)    a. 01/2010 Echo - EF 35%    PAST SURGICAL HISTOIRY:   Past Surgical History:  Procedure Laterality Date  . APPENDECTOMY    . BACK SURGERY     x 2  . CORONARY ARTERY BYPASS GRAFT  02/14/2010   x 3  . FRACTURE SURGERY    . KNEE SURGERY     bilateral knee surgery  . SHOULDER SURGERY     right    SOCIAL HISTORY:   Social History   Tobacco Use  . Smoking status: Never Smoker  . Smokeless tobacco: Never Used  Substance Use Topics  . Alcohol use: Yes    Comment: "very seldom" alcoholic beverage    FAMILY HISTORY:   Family  History  Problem Relation Age of Onset  . Cancer Mother        died @ 36  . Stroke Father        died @ 73 or pna    DRUG ALLERGIES:   Allergies  Allergen Reactions  . Penicillins Rash    REVIEW OF SYSTEMS:  Review of Systems  Constitutional: Negative for chills, fever and weight loss.  HENT: Negative for ear discharge, ear pain and nosebleeds.   Eyes: Negative for blurred vision, pain and discharge.  Respiratory: Negative for sputum production, shortness of breath, wheezing and stridor.   Cardiovascular: Negative for chest pain, palpitations, orthopnea and PND.  Gastrointestinal: Negative for abdominal pain, diarrhea, nausea and vomiting.  Genitourinary: Negative for frequency and urgency.  Musculoskeletal: Positive for falls and joint pain. Negative for back pain.  Neurological: Positive for weakness. Negative for sensory change, speech change and focal weakness.  Psychiatric/Behavioral: Negative for depression and hallucinations. The patient is not nervous/anxious.      MEDICATIONS AT HOME:   Prior to Admission medications   Medication Sig Start Date End Date Taking? Authorizing Provider  albuterol (VENTOLIN HFA) 108 (90 Base) MCG/ACT inhaler Inhale 2 puffs into the lungs every 6 (six) hours as needed for wheezing or shortness of breath.  Yes [provider]  aspirin 81 MG tablet Take 81 mg by mouth daily.     Yes [provider]  atorvastatin (LIPITOR) 80 MG tablet Take 1 tablet (80 mg total) by mouth daily. 02/28/11  Yes Gollan, Tollie Pizza, MD  carvedilol (COREG) 12.5 MG tablet Take 12.5 mg by mouth 2 (two) times daily with a meal.    Yes [provider]  ferrous sulfate 325 (65 FE) MG tablet Take 325 mg by mouth daily.    Yes [provider]  furosemide (LASIX) 20 MG tablet Take 20 mg by mouth daily.    Yes [provider]  magnesium oxide (MAG-OX) 400 MG tablet Take 400 mg by mouth daily.   Yes [provider]   omeprazole (PRILOSEC) 20 MG capsule Take 20 mg by mouth daily.     Yes [provider]  potassium chloride (KLOR-CON 10) 10 MEQ tablet Take 10 mEq by mouth daily. 02/25/15  Yes [provider]  budesonide-formoterol (SYMBICORT) 160-4.5 MCG/ACT inhaler Inhale 2 puffs into the lungs 2 (two) times daily. Patient not taking: Reported on 12/23/2018 08/20/16   Katharina Caper, MD  lisinopril (PRINIVIL,ZESTRIL) 10 MG tablet Take 1 tablet (10 mg total) by mouth daily. Patient not taking: Reported on 12/23/2018 08/20/16   Katharina Caper, MD  pravastatin (PRAVACHOL) 40 MG tablet Take 1 tablet (40 mg total) by mouth every evening. 07/23/11 11/06/11  Antonieta Iba, MD      VITAL SIGNS:  Blood pressure (!) 165/74, pulse 67, temperature 98.2 F (36.8 C), temperature source Oral, resp. rate 20, height 5\' 7"  (1.702 m), weight 88.5 kg, SpO2 91 %.  PHYSICAL EXAMINATION:  GENERAL:  85 y.o.-year-old patient lying in the bed with no acute distress.  EYES: Pupils equal, round, reactive to light and accommodation. No scleral icterus. Extraocular muscles intact.  HEENT: Head atraumatic, normocephalic. Oropharynx and nasopharynx clear.  NECK:  Supple, no jugular venous distention. No thyroid enlargement, no tenderness.  LUNGS: Normal breath sounds bilaterally, no wheezing, rales,rhonchi or crepitation. No use of accessory muscles of respiration.  CARDIOVASCULAR: S1, S2 normal. No murmurs, rubs, or gallops.  ABDOMEN: Soft, nontender, nondistended. Bowel sounds present. No organomegaly or mass.  EXTREMITIES: No pedal edema, cyanosis, or clubbing. Right hip decreased range of motion secondary to fracture NEUROLOGIC: Cranial nerves II through XII are intact. Muscle strength 5/5 in all extremities. Sensation intact. Gait not checked.  PSYCHIATRIC: The patient is alert and oriented x 3.  SKIN: No obvious rash, lesion, or ulcer.   LABORATORY PANEL:   CBC Recent Labs  Lab 12/23/18 0958  WBC 10.7*   HGB 12.1*  HCT 37.0*  PLT 166   ------------------------------------------------------------------------------------------------------------------  Chemistries  Recent Labs  Lab 12/23/18 0958  NA 142  K 4.2  CL 104  CO2 29  GLUCOSE 146*  BUN 34*  CREATININE 1.14  CALCIUM 9.2  AST 22  ALT 17  ALKPHOS 57  BILITOT 1.1   ------------------------------------------------------------------------------------------------------------------  Cardiac Enzymes No results for input(s): TROPONINI in the last 168 hours. ------------------------------------------------------------------------------------------------------------------  RADIOLOGY:  Dg Chest 1 View  Result Date: 12/23/2018 CLINICAL DATA:  Fall.  Preop EXAM: CHEST  1 VIEW COMPARISON:  08/17/2016 FINDINGS: CABG changes. Negative for heart failure. Small right pleural effusion or scarring is unchanged. Mild right lower lobe atelectasis or scarring is unchanged. Left lung clear Right shoulder replacement IMPRESSION: Chronic changes right lung base are stable from the prior study. No superimposed acute abnormality. Electronically Signed   By:  Marlan Palau M.D.   On: 12/23/2018 10:26   Ct Head Wo Contrast  Result Date: 12/23/2018 CLINICAL DATA:  Head trauma secondary to a fall in the bathroom last night. EXAM: CT HEAD WITHOUT CONTRAST TECHNIQUE: Contiguous axial images were obtained from the base of the skull through the vertex without intravenous contrast. COMPARISON:  None. FINDINGS: Brain: There is no acute hemorrhage or infarction. There is diffuse cerebral cortical atrophy with secondary ventricular dilatation. There is fairly extensive periventricular white matter lucency consistent with chronic small vessel ischemic changes. Vascular: No hyperdense vessel or unexpected calcification. Skull: Normal. Negative for fracture or focal lesion. Sinuses/Orbits: No significant abnormalities. Other: None. IMPRESSION: 1. No apparent acute  intracranial abnormality. 2. Moderate cerebral cortical atrophy. 3. Extensive periventricular white matter disease most likely small vessel ischemic change. Electronically Signed   By: Francene Boyers M.D.   On: 12/23/2018 10:19   Ct Femur Right Wo Contrast  Result Date: 12/23/2018 CLINICAL DATA:  Right hip and thigh pain since a fall last night. Previous mid right femur fracture treated with open reduction and internal fixation. EXAM: CT OF THE LOWER RIGHT EXTREMITY WITHOUT CONTRAST TECHNIQUE: Multidetector CT imaging of the right lower extremity was performed according to the standard protocol. COMPARISON:  None. FINDINGS: Bones/Joint/Cartilage There is a fracture of right iliac bone which extends inferiorly into the superior and medial and anterior aspects of the right acetabulum without significant displacement. The fracture extends into the base of the right superior pubic ramus. The inferior pubic ramus and right pubic body are intact. The right femur is intact. Old healed fractures of the midshaft of the right femur. Right total knee prosthesis in place. Intramedullary rod in the right femur. Muscles and Tendons No acute abnormalities. Atrophy of the vastus lateralis and vastus intermedius muscles. Slight atrophy of the right gluteal muscles. Soft tissues There is soft tissue stranding around the anterior aspect of the right acetabulum and around the right superior pubic ramus consistent with hemorrhage from the acetabular fracture. There is also prominence in the region of the right internal obturator muscle consistent with hemorrhage in that area. IMPRESSION: 1. Fracture of the right iliac bone and right acetabulum as described. 2. Slight hemorrhage into the adjacent soft tissues as described. 3. No acute abnormality of the right femur. Electronically Signed   By: Francene Boyers M.D.   On: 12/23/2018 11:48   Dg Hip Unilat With Pelvis 2-3 Views Right  Result Date: 12/23/2018 CLINICAL DATA:  Fall EXAM:  DG HIP (WITH OR WITHOUT PELVIS) 2-3V RIGHT COMPARISON:  None. FINDINGS: Negative for acute fracture Chronic healed fracture of the mid femur with intramedullary rod in good position. Right hip joint normal Lumbar hardware fusion with rods and screws. Total knee replacement on the right. Bone graft donor site right iliac crest. IMPRESSION: No acute fracture. Electronically Signed   By: Marlan Palau M.D.   On: 12/23/2018 10:25    EKG:    IMPRESSION AND PLAN:   Kazmir Oki  is a 85 y.o. male with a known history of combined systolic/diastolic congestive heart failure, hyperlipidemia, aortic sclerosis comes to the emergency room after he tripped and fell last night. Patient says he did not hit his head denies loss of consciousness.  1.Fracture of the right iliac bone and right acetabulum status post mechanical fall at home -is mild soft tissue hemorrhage in the area -conservative management per orthopedic Dr. Martha Clan. -IV and PO PRN pain meds -physical therapy to see patient -Child psychotherapist  for discharge planning  2. Combined systolic/diastolic congestive heart failure -patient is not in heart failure -continue current cardiac meds  3. Hypertension -continue losartan, Coreg  4. Hyperlipidemia on statins  5. DVT prophylaxis subcu heparin    All the records are reviewed and case discussed with ED provider.   CODE STATUS: DNR  TOTAL TIME TAKING CARE OF THIS PATIENT: 45 minutes.    Enedina FinnerSona Preciliano Castell M.D on 12/23/2018 at 1:25 PM  Between 7am to 6pm - Pager - [phone]  After 6pm go to [email or web address] - password [email or web address] [email or web address]  [facility]  Office  [phone]  CC: Primary care physician; Dortha KernBliss, Laura K, MD

## 2018-12-24 DIAGNOSIS — S32301A Unspecified fracture of right ilium, initial encounter for closed fracture: Secondary | ICD-10-CM | POA: Diagnosis not present

## 2018-12-24 LAB — MRSA PCR SCREENING: MRSA by PCR: NEGATIVE

## 2018-12-24 MED ORDER — OXYCODONE HCL 5 MG PO TABS: 5.0000 mg | ORAL_TABLET | ORAL | 0 refills | Status: AC | PRN

## 2018-12-24 NOTE — Care Management Obs Status (Signed)
MEDICARE OBSERVATION STATUS NOTIFICATION   Patient Details  Name: [name]. MRN: [id number] Date of Birth: [birth year]   Medicare Observation Status Notification Given:  Yes    Greenley Martone Clementeen Hoof, RN 12/24/2018, 2:27 PM

## 2018-12-24 NOTE — Progress Notes (Signed)
[facility] - [place] at [facility]   PATIENT NAME: [name]    MR#:  [id number]  DATE OF BIRTH:  11-Mar-1926  SUBJECTIVE:   Patient said he slept well last night. Had some hip pain. Eating breakfast. REVIEW OF SYSTEMS:   Review of Systems  Constitutional: Negative for chills, fever and weight loss.  HENT: Negative for ear discharge, ear pain and nosebleeds.   Eyes: Negative for blurred vision, pain and discharge.  Respiratory: Negative for sputum production, shortness of breath, wheezing and stridor.   Cardiovascular: Negative for chest pain, palpitations, orthopnea and PND.  Gastrointestinal: Negative for abdominal pain, diarrhea, nausea and vomiting.  Genitourinary: Negative for frequency and urgency.  Musculoskeletal: Negative for back pain and joint pain.  Neurological: Negative for sensory change, speech change, focal weakness and weakness.  Psychiatric/Behavioral: Negative for depression and hallucinations. The patient is not nervous/anxious.    Tolerating Diet:yes Tolerating PT: yes  DRUG ALLERGIES:   Allergies  Allergen Reactions  . Penicillins Rash    VITALS:  Blood pressure (!) 112/57, pulse 75, temperature 98.2 F (36.8 C), temperature source Oral, resp. rate 18, height 5\' 7"  (1.702 m), weight 88.5 kg, SpO2 94 %.  PHYSICAL EXAMINATION:   Physical Exam  GENERAL:  85 y.o.-year-old patient lying in the bed with no acute distress.  EYES: Pupils equal, round, reactive to light and accommodation. No scleral icterus. Extraocular muscles intact.  HEENT: Head atraumatic, normocephalic. Oropharynx and nasopharynx clear.  NECK:  Supple, no jugular venous distention. No thyroid enlargement, no tenderness.  LUNGS: Normal breath sounds bilaterally, no wheezing, rales, rhonchi. No use of accessory muscles of respiration.  CARDIOVASCULAR: S1, S2 normal. No murmurs, rubs, or gallops.  ABDOMEN: Soft, nontender, nondistended. Bowel sounds present. No  organomegaly or mass.  EXTREMITIES: No cyanosis, clubbing or edema b/l.   Decreased ROM on the right NEUROLOGIC: Cranial nerves II through XII are intact. No focal Motor or sensory deficits b/l.   PSYCHIATRIC:  patient is alert and oriented x 3.  SKIN: No obvious rash, lesion, or ulcer.   LABORATORY PANEL:  CBC Recent Labs  Lab 12/23/18 0958  WBC 10.7*  HGB 12.1*  HCT 37.0*  PLT 166    Chemistries  Recent Labs  Lab 12/23/18 0958  NA 142  K 4.2  CL 104  CO2 29  GLUCOSE 146*  BUN 34*  CREATININE 1.14  CALCIUM 9.2  AST 22  ALT 17  ALKPHOS 57  BILITOT 1.1   Cardiac Enzymes No results for input(s): TROPONINI in the last 168 hours. RADIOLOGY:  Dg Chest 1 View  Result Date: 12/23/2018 CLINICAL DATA:  Fall.  Preop EXAM: CHEST  1 VIEW COMPARISON:  08/17/2016 FINDINGS: CABG changes. Negative for heart failure. Small right pleural effusion or scarring is unchanged. Mild right lower lobe atelectasis or scarring is unchanged. Left lung clear Right shoulder replacement IMPRESSION: Chronic changes right lung base are stable from the prior study. No superimposed acute abnormality. Electronically Signed   By: Marlan Palau M.D.   On: 12/23/2018 10:26   Ct Head Wo Contrast  Result Date: 12/23/2018 CLINICAL DATA:  Head trauma secondary to a fall in the bathroom last night. EXAM: CT HEAD WITHOUT CONTRAST TECHNIQUE: Contiguous axial images were obtained from the base of the skull through the vertex without intravenous contrast. COMPARISON:  None. FINDINGS: Brain: There is no acute hemorrhage or infarction. There is diffuse cerebral cortical atrophy with secondary ventricular dilatation. There is fairly extensive periventricular white  matter lucency consistent with chronic small vessel ischemic changes. Vascular: No hyperdense vessel or unexpected calcification. Skull: Normal. Negative for fracture or focal lesion. Sinuses/Orbits: No significant abnormalities. Other: None. IMPRESSION: 1. No  apparent acute intracranial abnormality. 2. Moderate cerebral cortical atrophy. 3. Extensive periventricular white matter disease most likely small vessel ischemic change. Electronically Signed   By: Francene Boyers M.D.   On: 12/23/2018 10:19   Ct Femur Right Wo Contrast  Result Date: 12/23/2018 CLINICAL DATA:  Right hip and thigh pain since a fall last night. Previous mid right femur fracture treated with open reduction and internal fixation. EXAM: CT OF THE LOWER RIGHT EXTREMITY WITHOUT CONTRAST TECHNIQUE: Multidetector CT imaging of the right lower extremity was performed according to the standard protocol. COMPARISON:  None. FINDINGS: Bones/Joint/Cartilage There is a fracture of right iliac bone which extends inferiorly into the superior and medial and anterior aspects of the right acetabulum without significant displacement. The fracture extends into the base of the right superior pubic ramus. The inferior pubic ramus and right pubic body are intact. The right femur is intact. Old healed fractures of the midshaft of the right femur. Right total knee prosthesis in place. Intramedullary rod in the right femur. Muscles and Tendons No acute abnormalities. Atrophy of the vastus lateralis and vastus intermedius muscles. Slight atrophy of the right gluteal muscles. Soft tissues There is soft tissue stranding around the anterior aspect of the right acetabulum and around the right superior pubic ramus consistent with hemorrhage from the acetabular fracture. There is also prominence in the region of the right internal obturator muscle consistent with hemorrhage in that area. IMPRESSION: 1. Fracture of the right iliac bone and right acetabulum as described. 2. Slight hemorrhage into the adjacent soft tissues as described. 3. No acute abnormality of the right femur. Electronically Signed   By: Francene Boyers M.D.   On: 12/23/2018 11:48   Dg Hip Unilat With Pelvis 2-3 Views Right  Result Date: 12/23/2018 CLINICAL  DATA:  Fall EXAM: DG HIP (WITH OR WITHOUT PELVIS) 2-3V RIGHT COMPARISON:  None. FINDINGS: Negative for acute fracture Chronic healed fracture of the mid femur with intramedullary rod in good position. Right hip joint normal Lumbar hardware fusion with rods and screws. Total knee replacement on the right. Bone graft donor site right iliac crest. IMPRESSION: No acute fracture. Electronically Signed   By: Marlan Palau M.D.   On: 12/23/2018 10:25   ASSESSMENT AND PLAN:  Adolpho Meenach  is a 85 y.o. male with a known history of combined systolic/diastolic congestive heart failure, hyperlipidemia, aortic sclerosis comes to the emergency room after he tripped and fell last night. Patient says he did not hit his head denies loss of consciousness.  1.Fracture of the right iliac bone and right acetabulum status post mechanical fall at home -is mild soft tissue hemorrhage in the area -conservative management per orthopedic Dr. Caleen Jobs PT -IV and PO PRN pain meds -physical therapy evaluation noted -social worker for discharge planning  2. Combined systolic/diastolic congestive heart failure -patient is not in heart failure -continue current cardiac meds  3. Hypertension -continue losartan, Coreg  4. Hyperlipidemia on statins  5. DVT prophylaxis subcu heparin  Case discussed with Care Management/Social Worker. Management plans discussed with the patient, family and they are in agreement.  CODE STATUS: DNR  DVT Prophylaxis: heparin  TOTAL TIME TAKING CARE OF THIS PATIENT: 30 minutes.  >50% time spent on counselling and coordination of care  POSSIBLE D/C IN *1-2* DAYS,  DEPENDING ON CLINICAL CONDITION.  Note: This dictation was prepared with Dragon dictation along with smaller phrase technology. Any transcriptional errors that result from this process are unintentional.  Enedina FinnerSona Choua Ikner M.D on 12/24/2018 at 10:44 AM  Between 7am to 6pm - Pager - [phone]  After 6pm go to  [email or web address] - Social research officer, [email or web address] [email or web address]  [facility] [place] [facility]  Office  [phone]  CC: Primary care physician; Dortha KernBliss, Laura K, MDPatient ID: Jefferson Fuelalph Henderson Drudge Jr., male   DOB: 1926-07-18, [age] y.o.   MRN: [id number]

## 2018-12-24 NOTE — Evaluation (Signed)
Physical Therapy Evaluation Patient Details Name: [name]. MRN: [id number] DOB: 1926-03-23 Today's Date: 12/24/2018   History of Present Illness   85 y.o. male with a known history of combined systolic/diastolic congestive heart failure, hyperlipidemia, aortic sclerosis comes to the emergency room after he tripped and fell last night. work up showed fx of right iliac bone and R acetabulum    Clinical Impression  Patient alert, oriented, behavior WFLs on 1L via [place], increased to 2L for mobility due to mild desat, and then returned to 1L in chair, RN notified. Pt reported living alone in an apartment, previously independent with assistive devices.  Pt instructed in therapeutic exercises this session, able to perform with verbal/tactile cues, no physical assist needed. Supine to sit with minA and step by step verbal cues for sequencing. Pt educated and PT gave demonstration of proper adherence to touch down weight bearing precautions on RLE with use of RW. Sit<> Stand with RW and modA. Pt ambulated 1~ft towards chair. Pt with mild difficulty adhering to weight bearing precautions due to fatigue/generalized weakness, but with excellent effort and awareness of precautions.  Overall the patient demonstrated deficits (see "PT Problem List") that impede the patient's functional abilities, safety, and mobility and would benefit from skilled PT intervention. Recommendation is STR due to current level of assistance needed, continued education/safety concerns, and decreased caregiver support.      Follow Up Recommendations SNF    Equipment Recommendations  Rolling walker with 5" wheels    Recommendations for Other Services       Precautions / Restrictions Precautions Precautions: Fall Restrictions Weight Bearing Restrictions: Yes RLE Weight Bearing: Touchdown weight bearing      Mobility  Bed Mobility Overal bed mobility: Needs Assistance Bed Mobility: Supine to Sit        Sit to supine: Min assist      Transfers Overall transfer level: Needs assistance   Transfers: Sit to/from Stand Sit to Stand: Mod assist            Ambulation/Gait Ambulation/Gait assistance: Min assist Gait Distance (Feet): 1 Feet Assistive device: Rolling walker (2 wheeled)       General Gait Details: Pt with mild difficulty adhering to weight bearing precautions due to fatigue/generalized weakness, but with excellent effort and awareness of precautions.  Stairs            Wheelchair Mobility    Modified Rankin (Stroke Patients Only)       Balance Overall balance assessment: Needs assistance Sitting-balance support: Feet supported Sitting balance-Leahy Scale: Fair       Standing balance-Leahy Scale: Poor                               Pertinent Vitals/Pain Pain Assessment: Faces Faces Pain Scale: Hurts a little bit Pain Location: with mobility    Home Living Family/patient expects to be discharged to:: Private residence Living Arrangements: Alone Available Help at Discharge: Available PRN/intermittently;Friend(s) Type of Home: Apartment Home Access: Level entry     Home Layout: One level Home Equipment: Walker - 4 wheels;Cane - single point      Prior Function Level of Independence: Independent with assistive device(s)         Comments: drives, grocery shops     Hand Dominance        Extremity/Trunk Assessment   Upper Extremity Assessment Upper Extremity Assessment: Generalized weakness    Lower Extremity Assessment Lower  Extremity Assessment: Generalized weakness       Communication   Communication: No difficulties  Cognition Arousal/Alertness: Awake/alert Behavior During Therapy: WFL for tasks assessed/performed Overall Cognitive Status: Within Functional Limits for tasks assessed                                        General Comments      Exercises General Exercises - Lower  Extremity Ankle Circles/Pumps: AROM;Both;10 reps Quad Sets: AROM;Right;10 reps Long Arc Quad: AROM;Both;10 reps Heel Slides: AROM;Right;10 reps   Assessment/Plan    PT Assessment Patient needs continued PT services  PT Problem List Decreased strength;Decreased mobility;Decreased range of motion;Decreased knowledge of precautions;Decreased activity tolerance;Decreased balance;Decreased knowledge of use of DME;Cardiopulmonary status limiting activity;Pain       PT Treatment Interventions DME instruction;Functional mobility training;Balance training;Patient/family education;Gait training;Therapeutic activities;Neuromuscular re-education;Therapeutic exercise    PT Goals (Current goals can be found in the Care Plan section)  Acute Rehab PT Goals Patient Stated Goal: to return to PLOF PT Goal Formulation: With patient Time For Goal Achievement: 01/07/19 Potential to Achieve Goals: Good    Frequency BID   Barriers to discharge        Co-evaluation               AM-PAC PT "6 Clicks" Mobility  Outcome Measure Help needed turning from your back to your side while in a flat bed without using bedrails?: A Lot Help needed moving from lying on your back to sitting on the side of a flat bed without using bedrails?: A Lot Help needed moving to and from a bed to a chair (including a wheelchair)?: A Lot Help needed standing up from a chair using your arms (e.g., wheelchair or bedside chair)?: A Lot Help needed to walk in hospital room?: A Lot Help needed climbing 3-5 steps with a railing? : Total 6 Click Score: 11    End of Session Equipment Utilized During Treatment: Gait belt;Oxygen(1-2L) Activity Tolerance: Patient limited by fatigue Patient left: with SCD's reapplied;with call bell/phone within reach;in chair;with chair alarm set Nurse Communication: Mobility status PT Visit Diagnosis: Muscle weakness (generalized) (M62.81);Other abnormalities of gait and mobility  (R26.89);Pain Pain - Right/Left: Right Pain - part of body: Hip    Time: [id number] PT Time Calculation (min) (ACUTE ONLY): 31 min   Charges:   PT Evaluation $PT Eval Low Complexity: 1 Low PT Treatments $Therapeutic Exercise: 8-22 mins        Olga Coasteriana Kenzi Bardwell PT, DPT 10:38 AM,12/24/18 [phone]

## 2018-12-24 NOTE — Consult Note (Signed)
ORTHOPAEDIC CONSULTATION  REQUESTING PHYSICIAN: Enedina FinnerPatel, Sona, MD  Chief Complaint: Right hip pain status post fall  HPI: Glenn FuelRalph Henderson Vivona Jr. is a 85 y.o. male who complains of right hip pain status post fall at home.  Patient states that he was using his 4 wheeled walker and was going to lock the door for the evening when he fell to the right side.  He had pain in the right hip after the injury.  Patient was diagnosed with a acetabular fracture in the [facility] by x-ray and CT scan.  The [facility] is consulted for management of this fracture.  Patient currently denies any significant pain at rest.  Perform physical therapy today.  He is up out of bed to a chair.  Past Medical History:  Diagnosis Date  . Aortic sclerosis    a. by echo 01/2010 w/ systolic murmur  . CHF (congestive heart failure) (HCC)   . Coronary artery disease    a. 02/2010 s/p CABG x 3 - [place]->LAD, VG->[place], VG->D1.  Marland Kitchen. Hyperlipidemia   . Ischemic cardiomyopathy   . Labile hypertension   . Obesity   . Systolic CHF, chronic (HCC)    a. 01/2010 Echo - EF 35%   Past Surgical History:  Procedure Laterality Date  . APPENDECTOMY    . BACK SURGERY     x 2  . CORONARY ARTERY BYPASS GRAFT  02/14/2010   x 3  . FRACTURE SURGERY    . KNEE SURGERY     bilateral knee surgery  . SHOULDER SURGERY     right   Social History   Socioeconomic History  . Marital status: Divorced    Spouse name: Not on file  . Number of children: Not on file  . Years of education: Not on file  . Highest education level: Not on file  Occupational History  . Occupation: retired  Engineer, productionocial Needs  . Financial resource strain: Not on file  . Food insecurity:    Worry: Not on file    Inability: Not on file  . Transportation needs:    Medical: Not on file    Non-medical: Not on file  Tobacco Use  . Smoking status: Never Smoker  . Smokeless tobacco: Never Used  Substance and Sexual Activity  . Alcohol use: Yes     Comment: "very seldom" alcoholic beverage  . Drug use: No  . Sexual activity: Not Currently  Lifestyle  . Physical activity:    Days per week: Not on file    Minutes per session: Not on file  . Stress: Not on file  Relationships  . Social connections:    Talks on phone: Not on file    Gets together: Not on file    Attends religious service: Not on file    Active member of club or organization: Not on file    Attends meetings of clubs or organizations: Not on file    Relationship status: Not on file  Other Topics Concern  . Not on file  Social History Narrative   [place].  Retired from the [employer].   Family History  Problem Relation Age of Onset  . Cancer Mother        died @ 5979  . Stroke Father        died @ 4886 or pna   Allergies  Allergen Reactions  . Penicillins Rash   Prior to Admission medications   Medication  Sig Start Date End Date Taking? Authorizing Provider  acetaminophen (TYLENOL) 500 MG tablet Take 500-1,000 mg by mouth every 6 (six) hours as needed for pain or fever.   Yes [provider]  albuterol (VENTOLIN HFA) 108 (90 Base) MCG/ACT inhaler Inhale 2 puffs into the lungs every 6 (six) hours as needed for wheezing or shortness of breath.   Yes [provider]  aspirin 81 MG tablet Take 81 mg by mouth daily.     Yes [provider]  atorvastatin (LIPITOR) 80 MG tablet Take 1 tablet (80 mg total) by mouth daily. 02/28/11  Yes Gollan, Tollie Pizza, MD  carvedilol (COREG) 12.5 MG tablet Take 12.5 mg by mouth 2 (two) times daily with a meal.    Yes [provider]  ferrous sulfate 325 (65 FE) MG tablet Take 325 mg by mouth daily.    Yes [provider]  furosemide (LASIX) 20 MG tablet Take 20 mg by mouth daily.    Yes [provider]  losartan (COZAAR) 50 MG tablet Take 50 mg by mouth daily. 12/05/18  Yes [provider]  magnesium oxide (MAG-OX) 400  MG tablet Take 400 mg by mouth daily.   Yes [provider]  omeprazole (PRILOSEC) 20 MG capsule Take 20 mg by mouth daily.     Yes [provider]  potassium chloride (KLOR-CON 10) 10 MEQ tablet Take 10 mEq by mouth daily. 02/25/15  Yes [provider]  spironolactone (ALDACTONE) 25 MG tablet Take 25 mg by mouth daily. 12/05/18  Yes [provider]  vitamin B-12 (CYANOCOBALAMIN) 1000 MCG tablet Take 1,000 mcg by mouth daily.   Yes [provider]  oxyCODONE (OXY IR/ROXICODONE) 5 MG immediate release tablet Take 1 tablet (5 mg total) by mouth every 4 (four) hours as needed for moderate pain. 12/24/18   Enedina Finner, MD  pravastatin (PRAVACHOL) 40 MG tablet Take 1 tablet (40 mg total) by mouth every evening. 07/23/11 11/06/11  Antonieta Iba, MD   Dg Chest 1 View  Result Date: 12/23/2018 CLINICAL DATA:  Fall.  Preop EXAM: CHEST  1 VIEW COMPARISON:  08/17/2016 FINDINGS: CABG changes. Negative for heart failure. Small right pleural effusion or scarring is unchanged. Mild right lower lobe atelectasis or scarring is unchanged. Left lung clear Right shoulder replacement IMPRESSION: Chronic changes right lung base are stable from the prior study. No superimposed acute abnormality. Electronically Signed   By: Marlan Palau M.D.   On: 12/23/2018 10:26   Ct Head Wo Contrast  Result Date: 12/23/2018 CLINICAL DATA:  Head trauma secondary to a fall in the bathroom last night. EXAM: CT HEAD WITHOUT CONTRAST TECHNIQUE: Contiguous axial images were obtained from the base of the skull through the vertex without intravenous contrast. COMPARISON:  None. FINDINGS: Brain: There is no acute hemorrhage or infarction. There is diffuse cerebral cortical atrophy with secondary ventricular dilatation. There is fairly extensive periventricular white matter lucency consistent with chronic small vessel ischemic changes. Vascular: No hyperdense vessel or unexpected calcification. Skull:  Normal. Negative for fracture or focal lesion. Sinuses/Orbits: No significant abnormalities. Other: None. IMPRESSION: 1. No apparent acute intracranial abnormality. 2. Moderate cerebral cortical atrophy. 3. Extensive periventricular white matter disease most likely small vessel ischemic change. Electronically Signed   By: Francene Boyers M.D.   On: 12/23/2018 10:19   Ct Femur Right Wo Contrast  Result Date: 12/23/2018 CLINICAL DATA:  Right hip and thigh pain since a fall last night. Previous mid right femur  fracture treated with open reduction and internal fixation. EXAM: CT OF THE LOWER RIGHT EXTREMITY WITHOUT CONTRAST TECHNIQUE: Multidetector CT imaging of the right lower extremity was performed according to the standard protocol. COMPARISON:  None. FINDINGS: Bones/Joint/Cartilage There is a fracture of right iliac bone which extends inferiorly into the superior and medial and anterior aspects of the right acetabulum without significant displacement. The fracture extends into the base of the right superior pubic ramus. The inferior pubic ramus and right pubic body are intact. The right femur is intact. Old healed fractures of the midshaft of the right femur. Right total knee prosthesis in place. Intramedullary rod in the right femur. Muscles and Tendons No acute abnormalities. Atrophy of the vastus lateralis and vastus intermedius muscles. Slight atrophy of the right gluteal muscles. Soft tissues There is soft tissue stranding around the anterior aspect of the right acetabulum and around the right superior pubic ramus consistent with hemorrhage from the acetabular fracture. There is also prominence in the region of the right internal obturator muscle consistent with hemorrhage in that area. IMPRESSION: 1. Fracture of the right iliac bone and right acetabulum as described. 2. Slight hemorrhage into the adjacent soft tissues as described. 3. No acute abnormality of the right femur. Electronically Signed   By:  Francene Boyers M.D.   On: 12/23/2018 11:48   Dg Hip Unilat With Pelvis 2-3 Views Right  Result Date: 12/23/2018 CLINICAL DATA:  Fall EXAM: DG HIP (WITH OR WITHOUT PELVIS) 2-3V RIGHT COMPARISON:  None. FINDINGS: Negative for acute fracture Chronic healed fracture of the mid femur with intramedullary rod in good position. Right hip joint normal Lumbar hardware fusion with rods and screws. Total knee replacement on the right. Bone graft donor site right iliac crest. IMPRESSION: No acute fracture. Electronically Signed   By: Marlan Palau M.D.   On: 12/23/2018 10:25    Positive ROS: All other systems have been reviewed and were otherwise negative with the exception of those mentioned in the HPI and as above.  Physical Exam: General: Alert, no acute distress Skin: No lesions in the area of chief complaint Neurologic: Sensation intact distally Psychiatric: Patient is competent for consent with normal mood and affect  MUSCULOSKELETAL: Right lower extremity: Patient has no shortening or external rotation of the right lower extremity.  He has palpable pedal pulses and intact sensation to light touch.  Patient can flex and extend his toes and dorsiflex and plantarflex his ankle.  Patient has intact skin overlying the right hip.  There is no erythema ecchymosis or swelling seen.  Assessment: Right acetabular fracture  Plan: I reviewed the x-rays and CT scan taken in the ER.  Patient has a minimally displaced fracture of the acetabulum extending into the superior rami.  There is no evidence of hip fracture or protrusio.  Patient has prior lumbar instrumented fusion and intramedullary fixation of his right femur.  Patient will not require surgical intervention for this injury even its lack of displacement.  I recommend toe-touch weightbearing on the right lower extremity.  Patient will require physical therapy.  I have reviewed the patient physical therapy evaluation today.  He is being discharged to a  skilled nursing facility for further therapy.  Patient will follow-up in my office in 2 to 3 weeks for reevaluation and x-ray.   Juanell Fairly, MD    12/24/2018 12:12 PM

## 2018-12-24 NOTE — Care Management CC44 (Signed)
Condition Code 44 Documentation Completed  Patient Details  Name: [name]. MRN: [id number] Date of Birth: Nov 13, 1925   Condition Code 44 given:  Yes Patient signature on Condition Code 44 notice:  Yes Documentation of 2 MD's agreement:  Yes Code 44 added to claim:  Yes    Barrie Dunker, RN 12/24/2018, 2:27 PM

## 2018-12-24 NOTE — Progress Notes (Signed)
EMS here to transport pt, Bilateral hearing aids on, dentures in and cell phone and belongings sent with pt. Pt d/c on 1 Liter O2.

## 2018-12-24 NOTE — TOC Transition Note (Signed)
Transition of Care [facility]) - CM/SW Discharge Note   Patient Details  Name: [name]. MRN: [id number] Date of Birth: May 18, 1926  [facility] CM/SW Contact:  Barrie Dunker, RN Phone Number: 12/24/2018, 11:07 AM   Clinical Narrative:    Spoke to patient to discuss DC needs and plan, he stated that he wanted to go to [facility] and I spoke to [place] at [place] and he accepted the patient, patient will go to room E2 upon DC today, transported via EMS, Nurse to call report to [phone] Daughter Hilda Lias notified  EMS to be called by Bedside nurse for transport   Final next level of care: Skilled Nursing Facility Barriers to Discharge: Barriers Resolved   Patient Goals and CMS Choice Patient states their goals for this hospitalization and ongoing recovery are:: go to [facility] for STR CMS Medicare.gov Compare Post Acute Care list provided to:: Patient Choice offered to / list presented to : Patient  Discharge Placement                       Discharge Plan and Services   Discharge Planning Services: CM Consult Post Acute Care Choice: Skilled Nursing Facility                               Social Determinants of Health (SDOH) Interventions     Readmission Risk Interventions No flowsheet data found.

## 2018-12-24 NOTE — Discharge Summary (Signed)
[facility] [place] at [facility]   PATIENT NAME: [name]    MR#:  [id number]  DATE OF BIRTH:  Nov 14, 1925  DATE OF ADMISSION:  12/23/2018 ADMITTING PHYSICIAN: Enedina Finner, MD  DATE OF DISCHARGE: 12/24/2018  PRIMARY CARE PHYSICIAN: Dortha Kern, MD    ADMISSION DIAGNOSIS:  Fall [W19.XXXA] Closed nondisplaced fracture of right ilium, unspecified fracture morphology, initial encounter (HCC) [S32.301A]  DISCHARGE DIAGNOSIS:  .Fracture of the right iliac bone and right acetabulumstatus post mechanical fall at home  SECONDARY DIAGNOSIS:   Past Medical History:  Diagnosis Date  . Aortic sclerosis    a. by echo 01/2010 w/ systolic murmur  . CHF (congestive heart failure) (HCC)   . Coronary artery disease    a. 02/2010 s/p CABG x 3 - Lima->LAD, VG->Ramus, VG->D1.  Marland Kitchen Hyperlipidemia   . Ischemic cardiomyopathy   . Labile hypertension   . Obesity   . Systolic CHF, chronic (HCC)    a. 01/2010 Echo - EF 35%    HOSPITAL COURSE:  RalphScottis a89 y.o.malewith a known history of combined systolic/diastolic congestive heart failure, hyperlipidemia, aortic sclerosis comes to the emergency room after he tripped and fell last night. Patient says he did not hit his head denies loss of consciousness.  1.Fracture of the right iliac bone and right acetabulumstatus post mechanical fall at home -is mild soft tissue hemorrhage in the area -conservative management per orthopedic Dr. Lenon Curt PT -IV and PO PRN pain meds -physical therapy evaluation noted -social worker for discharge planning--to [facility] today  2.Combined systolic/diastolic congestive heart failure -patient is not in heart failure -continue current cardiac meds  3.Hypertension -continuelosartan, Coreg  4.Hyperlipidemia on statins  5.DVT prophylaxis subcu heparin  Pt is agreeable with the plan. Left msg for dter  CONSULTS OBTAINED:  Treatment Team:   Juanell Fairly, MD  DRUG ALLERGIES:   Allergies  Allergen Reactions  . Penicillins Rash    DISCHARGE MEDICATIONS:   Allergies as of 12/24/2018      Reactions   Penicillins Rash      Medication List    TAKE these medications   acetaminophen 500 MG tablet Commonly known as:  TYLENOL Take 500-1,000 mg by mouth every 6 (six) hours as needed for pain or fever.   albuterol 108 (90 Base) MCG/ACT inhaler Commonly known as:  VENTOLIN HFA Inhale 2 puffs into the lungs every 6 (six) hours as needed for wheezing or shortness of breath.   aspirin 81 MG tablet Take 81 mg by mouth daily.   atorvastatin 80 MG tablet Commonly known as:  LIPITOR Take 1 tablet (80 mg total) by mouth daily.   carvedilol 12.5 MG tablet Commonly known as:  COREG Take 12.5 mg by mouth 2 (two) times daily with a meal.   ferrous sulfate 325 (65 FE) MG tablet Take 325 mg by mouth daily.   furosemide 20 MG tablet Commonly known as:  LASIX Take 20 mg by mouth daily.   Klor-Con 10 10 MEQ tablet Generic drug:  potassium chloride Take 10 mEq by mouth daily.   losartan 50 MG tablet Commonly known as:  COZAAR Take 50 mg by mouth daily.   magnesium oxide 400 MG tablet Commonly known as:  MAG-OX Take 400 mg by mouth daily.   omeprazole 20 MG capsule Commonly known as:  PRILOSEC Take 20 mg by mouth daily.   oxyCODONE 5 MG immediate release tablet Commonly known as:  Oxy IR/ROXICODONE Take 1 tablet (5 mg total)  by mouth every 4 (four) hours as needed for moderate pain.   spironolactone 25 MG tablet Commonly known as:  ALDACTONE Take 25 mg by mouth daily.   vitamin B-12 1000 MCG tablet Commonly known as:  CYANOCOBALAMIN Take 1,000 mcg by mouth daily.       If you experience worsening of your admission symptoms, develop shortness of breath, life threatening emergency, suicidal or homicidal thoughts you must seek medical attention immediately by calling 911 or calling your MD immediately  if  symptoms less severe.  You Must read complete instructions/literature along with all the possible adverse reactions/side effects for all the Medicines you take and that have been prescribed to you. Take any new Medicines after you have completely understood and accept all the possible adverse reactions/side effects.   Please note  You were cared for by a hospitalist during your hospital stay. If you have any questions about your discharge medications or the care you received while you were in the hospital after you are discharged, you can call the unit and asked to speak with the hospitalist on call if the hospitalist that took care of you is not available. Once you are discharged, your primary care physician will handle any further medical issues. Please note that NO REFILLS for any discharge medications will be authorized once you are discharged, as it is imperative that you return to your primary care physician (or establish a relationship with a primary care physician if you do not have one) for your aftercare needs so that they can reassess your need for medications and monitor your lab values. DATA REVIEW:   CBC  Recent Labs  Lab 12/23/18 0958  WBC 10.7*  HGB 12.1*  HCT 37.0*  PLT 166    Chemistries  Recent Labs  Lab 12/23/18 0958  NA 142  K 4.2  CL 104  CO2 29  GLUCOSE 146*  BUN 34*  CREATININE 1.14  CALCIUM 9.2  AST 22  ALT 17  ALKPHOS 57  BILITOT 1.1    Microbiology Results   Recent Results (from the past 240 hour(s))  SARS Coronavirus 2 (CEPHEID - Performed in [facility] hospital lab), Hosp Order     Status: None   Collection Time: 12/23/18  1:51 PM  Result Value Ref Range Status   SARS Coronavirus 2 NEGATIVE NEGATIVE Final    Comment: (NOTE) If result is NEGATIVE SARS-CoV-2 target nucleic acids are NOT DETECTED. The SARS-CoV-2 RNA is generally detectable in upper and lower  respiratory specimens during the acute phase of infection. The lowest   concentration of SARS-CoV-2 viral copies this assay can detect is 250  copies / mL. A negative result does not preclude SARS-CoV-2 infection  and should not be used as the sole basis for treatment or other  patient management decisions.  A negative result may occur with  improper specimen collection / handling, submission of specimen other  than nasopharyngeal swab, presence of viral mutation(s) within the  areas targeted by this assay, and inadequate number of viral copies  (<250 copies / mL). A negative result must be combined with clinical  observations, patient history, and epidemiological information. If result is POSITIVE SARS-CoV-2 target nucleic acids are DETECTED. The SARS-CoV-2 RNA is generally detectable in upper and lower  respiratory specimens dur ing the acute phase of infection.  Positive  results are indicative of active infection with SARS-CoV-2.  Clinical  correlation with patient history and other diagnostic information is  necessary to determine patient infection  status.  Positive results do  not rule out bacterial infection or co-infection with other viruses. If result is PRESUMPTIVE POSTIVE SARS-CoV-2 nucleic acids MAY BE PRESENT.   A presumptive positive result was obtained on the submitted specimen  and confirmed on repeat testing.  While 2019 novel coronavirus  (SARS-CoV-2) nucleic acids may be present in the submitted sample  additional confirmatory testing may be necessary for epidemiological  and / or clinical management purposes  to differentiate between  SARS-CoV-2 and other Sarbecovirus currently known to infect humans.  If clinically indicated additional testing with an alternate test  methodology [phone] is advised. The SARS-CoV-2 RNA is generally  detectable in upper and lower respiratory sp ecimens during the acute  phase of infection. The expected result is Negative. Fact Sheet for Patients:  BoilerBrush.com.cyhttps://www.fda.gov/media/136312/download Fact Sheet  for Healthcare Providers: https://pope.com/https://www.fda.gov/media/136313/download This test is not yet approved or cleared by the [place] FDA and has been authorized for detection and/or diagnosis of SARS-CoV-2 by FDA under an Emergency Use Authorization (EUA).  This EUA will remain in effect (meaning this test can be used) for the duration of the COVID-19 declaration under Section 564(b)(1) of the Act, 21 U.S.C. section 360bbb-3(b)(1), unless the authorization is terminated or revoked sooner. Performed at [facility], [address] [place], [place] [id number]     RADIOLOGY:  Dg Chest 1 View  Result Date: 12/23/2018 CLINICAL DATA:  Fall.  Preop EXAM: CHEST  1 VIEW COMPARISON:  08/17/2016 FINDINGS: CABG changes. Negative for heart failure. Small right pleural effusion or scarring is unchanged. Mild right lower lobe atelectasis or scarring is unchanged. Left lung clear Right shoulder replacement IMPRESSION: Chronic changes right lung base are stable from the prior study. No superimposed acute abnormality. Electronically Signed   By: Marlan Palauharles  Clark M.D.   On: 12/23/2018 10:26   Ct Head Wo Contrast  Result Date: 12/23/2018 CLINICAL DATA:  Head trauma secondary to a fall in the bathroom last night. EXAM: CT HEAD WITHOUT CONTRAST TECHNIQUE: Contiguous axial images were obtained from the base of the skull through the vertex without intravenous contrast. COMPARISON:  None. FINDINGS: Brain: There is no acute hemorrhage or infarction. There is diffuse cerebral cortical atrophy with secondary ventricular dilatation. There is fairly extensive periventricular white matter lucency consistent with chronic small vessel ischemic changes. Vascular: No hyperdense vessel or unexpected calcification. Skull: Normal. Negative for fracture or focal lesion. Sinuses/Orbits: No significant abnormalities. Other: None. IMPRESSION: 1. No apparent acute intracranial abnormality. 2. Moderate cerebral cortical atrophy. 3.  Extensive periventricular white matter disease most likely small vessel ischemic change. Electronically Signed   By: Francene BoyersJames  Maxwell M.D.   On: 12/23/2018 10:19   Ct Femur Right Wo Contrast  Result Date: 12/23/2018 CLINICAL DATA:  Right hip and thigh pain since a fall last night. Previous mid right femur fracture treated with open reduction and internal fixation. EXAM: CT OF THE LOWER RIGHT EXTREMITY WITHOUT CONTRAST TECHNIQUE: Multidetector CT imaging of the right lower extremity was performed according to the standard protocol. COMPARISON:  None. FINDINGS: Bones/Joint/Cartilage There is a fracture of right iliac bone which extends inferiorly into the superior and medial and anterior aspects of the right acetabulum without significant displacement. The fracture extends into the base of the right superior pubic ramus. The inferior pubic ramus and right pubic body are intact. The right femur is intact. Old healed fractures of the midshaft of the right femur. Right total knee prosthesis in place. Intramedullary rod in the right femur. Muscles  and Tendons No acute abnormalities. Atrophy of the vastus lateralis and vastus intermedius muscles. Slight atrophy of the right gluteal muscles. Soft tissues There is soft tissue stranding around the anterior aspect of the right acetabulum and around the right superior pubic ramus consistent with hemorrhage from the acetabular fracture. There is also prominence in the region of the right internal obturator muscle consistent with hemorrhage in that area. IMPRESSION: 1. Fracture of the right iliac bone and right acetabulum as described. 2. Slight hemorrhage into the adjacent soft tissues as described. 3. No acute abnormality of the right femur. Electronically Signed   By: Francene Boyers M.D.   On: 12/23/2018 11:48   Dg Hip Unilat With Pelvis 2-3 Views Right  Result Date: 12/23/2018 CLINICAL DATA:  Fall EXAM: DG HIP (WITH OR WITHOUT PELVIS) 2-3V RIGHT COMPARISON:  None.  FINDINGS: Negative for acute fracture Chronic healed fracture of the mid femur with intramedullary rod in good position. Right hip joint normal Lumbar hardware fusion with rods and screws. Total knee replacement on the right. Bone graft donor site right iliac crest. IMPRESSION: No acute fracture. Electronically Signed   By: Marlan Palau M.D.   On: 12/23/2018 10:25     CODE STATUS:     Code Status Orders  (From admission, onward)         Start     Ordered   12/23/18 1741  Do not attempt resuscitation (DNR)  Continuous    Question Answer Comment  In the event of cardiac or respiratory ARREST Do not call a "code blue"   In the event of cardiac or respiratory ARREST Do not perform Intubation, CPR, defibrillation or ACLS   In the event of cardiac or respiratory ARREST Use medication by any route, position, wound care, and other measures to relive pain and suffering. May use oxygen, suction and manual treatment of airway obstruction as needed for comfort.      12/23/18 1740        Code Status History    Date Active Date Inactive Code Status Order ID Comments User Context   08/18/2016 0517 08/20/2016 1838 Full Code [id number]  Ihor Austin, MD Inpatient      TOTAL TIME TAKING CARE OF THIS PATIENT: *40* minutes.    Enedina Finner M.D on 12/24/2018 at 11:29 AM  Between 7am to 6pm - Pager - [phone] After 6pm go to [email or web address] - password [email or web address]  [facility]  Office  [phone]  CC: Primary care physician; Dortha Kern, MD

## 2018-12-24 NOTE — [place] FL2 (Signed)
[place] MEDICAID FL2 LEVEL OF CARE SCREENING TOOL     IDENTIFICATION  Patient Name: [name]. Birthdate: 1926-01-27 Sex: male Admission Date (Current Location): 12/23/2018  [place] and [place] Number:  Chiropodist and Address:  [facility], [address] [place], [place] [address]      Provider Number: [id number]  Attending Physician Name and Address:  Enedina Finner, MD  Relative Name and Phone Number:  Fransisco Beau daughter [phone]    Current Level of Care: Hospital Recommended Level of Care: Skilled Nursing Facility Prior Approval Number:    Date Approved/Denied: 01/22/05 PASRR Number: [id number]  Discharge Plan: SNF    Current Diagnoses: Patient Active Problem List   Diagnosis Date Noted  . Pelvic fracture (HCC) 12/23/2018  . Carotid stenosis 09/10/2016  . Acute on chronic respiratory failure with hypoxia (HCC) 08/20/2016  . COPD exacerbation (HCC) 08/20/2016  . Cholecystitis, acute 08/20/2016  . Cholelithiasis 08/20/2016  . Elevated transaminase level 08/20/2016  . Elevated troponin 08/20/2016  . Anemia 08/20/2016  . Essential hypertension, malignant 08/20/2016  . Acute on chronic systolic CHF (congestive heart failure) (HCC) 08/18/2016  . Symptomatic cholelithiasis   . Elevated liver enzymes   . Ischemic cardiomyopathy   . Systolic CHF, chronic (HCC)   . Aortic sclerosis   . HYPERTENSION, BENIGN 05/23/2010  . CORONARY ARTERY BYPASS GRAFT, THREE VESSEL, HX OF 03/09/2010  . Hyperlipidemia 02/09/2010  . EDEMA 02/02/2010  . SHORTNESS OF BREATH 02/02/2010    Orientation RESPIRATION BLADDER Height & Weight     Self, Time, Situation, Place  Normal Continent Weight: 88.5 kg Height:  5\' 7"  (170.2 cm)  BEHAVIORAL SYMPTOMS/MOOD NEUROLOGICAL BOWEL NUTRITION STATUS      Continent Diet(regular)  AMBULATORY STATUS COMMUNICATION OF NEEDS Skin   Extensive Assist Verbally Normal                        Personal Care Assistance Level of Assistance  Bathing, Dressing Bathing Assistance: Limited assistance   Dressing Assistance: Limited assistance     Functional Limitations Info  Sight, Hearing, Speech Sight Info: Adequate Hearing Info: Adequate      SPECIAL CARE FACTORS FREQUENCY  PT (By licensed PT), OT (By licensed OT)     PT Frequency: 5 times per week OT Frequency: 5 times per week            Contractures Contractures Info: Not present    Additional Factors Info  Code Status Code Status Info: DNR             Current Medications (12/24/2018):  This is the current hospital active medication list Current Facility-Administered Medications  Medication Dose Route Frequency Provider Last Rate Last Dose  . acetaminophen (TYLENOL) tablet 650 mg  650 mg Oral Q6H PRN Enedina Finner, MD       Or  . acetaminophen (TYLENOL) suppository 650 mg  650 mg Rectal Q6H PRN Enedina Finner, MD      . albuterol (PROVENTIL) (2.5 MG/3ML) 0.083% nebulizer solution 2.5 mg  2.5 mg Inhalation Q6H PRN Enedina Finner, MD      . aspirin EC tablet 81 mg  81 mg Oral Daily Enedina Finner, MD   81 mg at 12/24/18 0948  . atorvastatin (LIPITOR) tablet 80 mg  80 mg Oral Daily Enedina Finner, MD   80 mg at 12/24/18 0948  . carvedilol (COREG) tablet 12.5 mg  12.5 mg Oral BID WC Enedina Finner, MD  12.5 mg at 12/24/18 0829  . docusate sodium (COLACE) capsule 100 mg  100 mg Oral BID Enedina FinnerPatel, Sona, MD   100 mg at 12/23/18 2031  . ferrous sulfate tablet 325 mg  325 mg Oral Daily Enedina FinnerPatel, Sona, MD   325 mg at 12/24/18 0948  . furosemide (LASIX) tablet 20 mg  20 mg Oral Daily Enedina FinnerPatel, Sona, MD   20 mg at 12/24/18 0948  . heparin injection 5,000 Units  5,000 Units Subcutaneous Q8H Enedina FinnerPatel, Sona, MD   5,000 Units at 12/24/18 0437  . losartan (COZAAR) tablet 50 mg  50 mg Oral Daily Enedina FinnerPatel, Sona, MD   Stopped at 12/24/18 [phone]  . magnesium oxide (MAG-OX) tablet 400 mg  400 mg Oral Daily Enedina FinnerPatel, Sona, MD   400 mg at 12/24/18 0948  .  ondansetron (ZOFRAN) tablet 4 mg  4 mg Oral Q6H PRN Enedina FinnerPatel, Sona, MD       Or  . ondansetron [facility](ZOFRAN) injection 4 mg  4 mg Intravenous Q6H PRN Enedina FinnerPatel, Sona, MD      . oxyCODONE (Oxy IR/ROXICODONE) immediate release tablet 5 mg  5 mg Oral Q4H PRN Enedina FinnerPatel, Sona, MD   5 mg at 12/24/18 0236  . pantoprazole (PROTONIX) EC tablet 40 mg  40 mg Oral Daily Enedina FinnerPatel, Sona, MD   40 mg at 12/24/18 0948  . polyethylene glycol (MIRALAX / GLYCOLAX) packet 17 g  17 g Oral Daily PRN Enedina FinnerPatel, Sona, MD      . potassium chloride (K-DUR) CR tablet 10 mEq  10 mEq Oral Daily Enedina FinnerPatel, Sona, MD   10 mEq at 12/24/18 0948  . spironolactone (ALDACTONE) tablet 25 mg  25 mg Oral Daily Enedina FinnerPatel, Sona, MD   25 mg at 12/24/18 0948  . vitamin B-12 (CYANOCOBALAMIN) tablet 1,000 mcg  1,000 mcg Oral Daily Enedina FinnerPatel, Sona, MD   1,000 mcg at 12/24/18 [id number]     Discharge Medications: Please see discharge summary for a list of discharge medications.  Relevant Imaging Results:  Relevant Lab Results:   Additional Information [id number]  Barrie Dunkereliliah J Celestial Barnfield, RN

## 2018-12-24 NOTE — Progress Notes (Signed)
Report called to Amil Amen, RN EMS called for transportation.

## 2019-07-19 ENCOUNTER — Other Ambulatory Visit: Payer: Self-pay

## 2019-07-19 ENCOUNTER — Emergency Department: Payer: Medicare Other

## 2019-07-19 ENCOUNTER — Encounter: Payer: Self-pay | Admitting: Emergency Medicine

## 2019-07-19 ENCOUNTER — Emergency Department
Admission: EM | Admit: 2019-07-19 | Discharge: 2019-07-19 | Disposition: A | Discharge status: Home / Self Care | Payer: Medicare Other | Attending: Emergency Medicine | Admitting: Emergency Medicine

## 2019-07-19 DIAGNOSIS — S79912A Unspecified injury of left hip, initial encounter: Secondary | ICD-10-CM | POA: Diagnosis present

## 2019-07-19 DIAGNOSIS — Z951 Presence of aortocoronary bypass graft: Secondary | ICD-10-CM | POA: Insufficient documentation

## 2019-07-19 DIAGNOSIS — Y999 Unspecified external cause status: Secondary | ICD-10-CM | POA: Insufficient documentation

## 2019-07-19 DIAGNOSIS — Y92129 Unspecified place in nursing home as the place of occurrence of the external cause: Secondary | ICD-10-CM | POA: Insufficient documentation

## 2019-07-19 DIAGNOSIS — I110 Hypertensive heart disease with heart failure: Secondary | ICD-10-CM | POA: Insufficient documentation

## 2019-07-19 DIAGNOSIS — M79606 Pain in leg, unspecified: Secondary | ICD-10-CM

## 2019-07-19 DIAGNOSIS — Y939 Activity, unspecified: Secondary | ICD-10-CM | POA: Diagnosis not present

## 2019-07-19 DIAGNOSIS — Z79899 Other long term (current) drug therapy: Secondary | ICD-10-CM | POA: Insufficient documentation

## 2019-07-19 DIAGNOSIS — S7002XA Contusion of left hip, initial encounter: Secondary | ICD-10-CM | POA: Diagnosis not present

## 2019-07-19 DIAGNOSIS — W1830XA Fall on same level, unspecified, initial encounter: Secondary | ICD-10-CM | POA: Insufficient documentation

## 2019-07-19 DIAGNOSIS — I2510 Atherosclerotic heart disease of native coronary artery without angina pectoris: Secondary | ICD-10-CM | POA: Diagnosis not present

## 2019-07-19 DIAGNOSIS — M79604 Pain in right leg: Secondary | ICD-10-CM | POA: Insufficient documentation

## 2019-07-19 DIAGNOSIS — R410 Disorientation, unspecified: Secondary | ICD-10-CM | POA: Insufficient documentation

## 2019-07-19 DIAGNOSIS — S61511A Laceration without foreign body of right wrist, initial encounter: Secondary | ICD-10-CM | POA: Insufficient documentation

## 2019-07-19 DIAGNOSIS — S51011A Laceration without foreign body of right elbow, initial encounter: Secondary | ICD-10-CM | POA: Insufficient documentation

## 2019-07-19 DIAGNOSIS — J449 Chronic obstructive pulmonary disease, unspecified: Secondary | ICD-10-CM | POA: Diagnosis not present

## 2019-07-19 DIAGNOSIS — I5022 Chronic systolic (congestive) heart failure: Secondary | ICD-10-CM | POA: Insufficient documentation

## 2019-07-19 DIAGNOSIS — Z7982 Long term (current) use of aspirin: Secondary | ICD-10-CM | POA: Insufficient documentation

## 2019-07-19 DIAGNOSIS — W19XXXA Unspecified fall, initial encounter: Secondary | ICD-10-CM

## 2019-07-19 LAB — URINALYSIS, COMPLETE (UACMP) WITH MICROSCOPIC
Bacteria, UA: NONE SEEN
Bilirubin Urine: NEGATIVE
Glucose, UA: NEGATIVE mg/dL
Hgb urine dipstick: NEGATIVE
Ketones, ur: NEGATIVE mg/dL
Leukocytes,Ua: NEGATIVE
Nitrite: NEGATIVE
Protein, ur: NEGATIVE mg/dL
Specific Gravity, Urine: 1.019 (ref 1.005–1.030)
Squamous Epithelial / HPF: NONE SEEN (ref 0–5)
pH: 5 (ref 5.0–8.0)

## 2019-07-19 LAB — CBC
HCT: 36.5 % — ABNORMAL LOW (ref 39.0–52.0)
Hemoglobin: 12 g/dL — ABNORMAL LOW (ref 13.0–17.0)
MCH: 30.3 pg (ref 26.0–34.0)
MCHC: 32.9 g/dL (ref 30.0–36.0)
MCV: 92.2 fL (ref 80.0–100.0)
Platelets: 208 10*3/uL (ref 150–400)
RBC: 3.96 MIL/uL — ABNORMAL LOW (ref 4.22–5.81)
RDW: 15 % (ref 11.5–15.5)
WBC: 14 10*3/uL — ABNORMAL HIGH (ref 4.0–10.5)
nRBC: 0 % (ref 0.0–0.2)

## 2019-07-19 LAB — BASIC METABOLIC PANEL
Anion gap: 9 (ref 5–15)
BUN: 28 mg/dL — ABNORMAL HIGH (ref 8–23)
CO2: 32 mmol/L (ref 22–32)
Calcium: 8.8 mg/dL — ABNORMAL LOW (ref 8.9–10.3)
Chloride: 98 mmol/L (ref 98–111)
Creatinine, Ser: 1.15 mg/dL (ref 0.61–1.24)
GFR calc Af Amer: >60 mL/min (ref >60)
GFR calc non Af Amer: 55 mL/min — ABNORMAL LOW (ref >60)
Glucose, Bld: 117 mg/dL — ABNORMAL HIGH (ref 70–99)
Potassium: 4.9 mmol/L (ref 3.5–5.1)
Sodium: 139 mmol/L (ref 135–145)

## 2019-07-19 LAB — TROPONIN I (HIGH SENSITIVITY)
Troponin I (High Sensitivity): 16 ng/L (ref <18)
Troponin I (High Sensitivity): 19 ng/L — ABNORMAL HIGH (ref <18)

## 2019-07-19 LAB — GLUCOSE, CAPILLARY: Glucose-Capillary: 81 mg/dL (ref 70–99)

## 2019-07-19 LAB — CK: Total CK: 133 U/L (ref 49–397)

## 2019-07-19 MED ORDER — NYSTATIN 100000 UNIT/GM EX POWD
Freq: Once | CUTANEOUS | Status: AC
  Administered 2019-07-19: 17:00:00 via TOPICAL
  Filled 2019-07-19: qty 15

## 2019-07-19 NOTE — ED Notes (Signed)
Attempted to call report to [facility] with no answer, voicemail left.

## 2019-07-19 NOTE — ED Notes (Signed)
Called daughter Hoyle Sauer and updated regarding pt's condition and discharge. Daughter stated that "nobody at [facility]/ [facility] answers the phone on the weekend" and to bring the pt back.

## 2019-07-19 NOTE — ED Provider Notes (Signed)
[facility] Emergency Department Provider Note   ____________________________________________   First MD Initiated Contact with Patient 07/19/19 [phone]     (approximate)  I have reviewed the triage vital signs and the nursing notes.   HISTORY  Chief Complaint Fall    HPI Glenn GarbeRalph Henderson Jones BroomScott Jr. is a 85 y.o. male with past medical history listed below who presents to the ED for a fall.  [facility] and was found by staff to be lying on top of his walker earlier this morning.  Fall was not witnessed and patient is unsure what may have caused him to fall.  Staff believes he struck his head on the dresser and he is unsure whether he lost consciousness.  EMS reports concerned that he had pain when moving either leg, staff at also noticed skin tear to his right elbow and wrist.  Patient currently denies any complaints.        Past Medical History:  Diagnosis Date  . Aortic sclerosis    a. by echo 01/2010 w/ systolic murmur  . CHF (congestive heart failure) (HCC)   . Coronary artery disease    a. 02/2010 s/p CABG x 3 - [place]->LAD, VG->[place], VG->D1.  Marland Kitchen. Hyperlipidemia   . Ischemic cardiomyopathy   . Labile hypertension   . Obesity   . Systolic CHF, chronic (HCC)    a. 01/2010 Echo - EF 35%    Patient Active Problem List   Diagnosis Date Noted  . Pelvic fracture (HCC) 12/23/2018  . Carotid stenosis 09/10/2016  . Acute on chronic respiratory failure with hypoxia (HCC) 08/20/2016  . COPD exacerbation (HCC) 08/20/2016  . Cholecystitis, acute 08/20/2016  . Cholelithiasis 08/20/2016  . Elevated transaminase level 08/20/2016  . Elevated troponin 08/20/2016  . Anemia 08/20/2016  . Essential hypertension, malignant 08/20/2016  . Acute on chronic systolic CHF (congestive heart failure) (HCC) 08/18/2016  . Symptomatic cholelithiasis   . Elevated liver enzymes   . Ischemic cardiomyopathy   . Systolic CHF, chronic (HCC)   .  Aortic sclerosis   . HYPERTENSION, BENIGN 05/23/2010  . CORONARY ARTERY BYPASS GRAFT, THREE VESSEL, HX OF 03/09/2010  . Hyperlipidemia 02/09/2010  . EDEMA 02/02/2010  . SHORTNESS OF BREATH 02/02/2010    Past Surgical History:  Procedure Laterality Date  . APPENDECTOMY    . BACK SURGERY     x 2  . CORONARY ARTERY BYPASS GRAFT  02/14/2010   x 3  . FRACTURE SURGERY    . KNEE SURGERY     bilateral knee surgery  . SHOULDER SURGERY     right    Prior to Admission medications   Medication Sig Start Date End Date Taking? Authorizing Provider  acetaminophen (TYLENOL) 500 MG tablet Take 500-1,000 mg by mouth every 6 (six) hours as needed for pain or fever.    [provider]  albuterol (VENTOLIN HFA) 108 (90 Base) MCG/ACT inhaler Inhale 2 puffs into the lungs every 6 (six) hours as needed for wheezing or shortness of breath.    [provider]  aspirin 81 MG tablet Take 81 mg by mouth daily.      [provider]  atorvastatin (LIPITOR) 80 MG tablet Take 1 tablet (80 mg total) by mouth daily. 02/28/11   Antonieta IbaGollan, Timothy J, MD  carvedilol (COREG) 12.5 MG tablet Take 12.5 mg by mouth 2 (two) times daily with a meal.     [provider]  ferrous sulfate 325 (65  FE) MG tablet Take 325 mg by mouth daily.     [provider]  furosemide (LASIX) 20 MG tablet Take 20 mg by mouth daily.     [provider]  losartan (COZAAR) 50 MG tablet Take 50 mg by mouth daily. 12/05/18   [provider]  magnesium oxide (MAG-OX) 400 MG tablet Take 400 mg by mouth daily.    [provider]  omeprazole (PRILOSEC) 20 MG capsule Take 20 mg by mouth daily.      [provider]  oxyCODONE (OXY IR/ROXICODONE) 5 MG immediate release tablet Take 1 tablet (5 mg total) by mouth every 4 (four) hours as needed for moderate pain. 12/24/18   Fritzi Mandes, MD  potassium chloride (KLOR-CON 10) 10 MEQ tablet Take 10 mEq by mouth daily. 02/25/15   [provider]  spironolactone (ALDACTONE) 25 MG tablet Take 25 mg by mouth daily. 12/05/18   [provider]  vitamin B-12 (CYANOCOBALAMIN) 1000 MCG tablet Take 1,000 mcg by mouth daily.    [provider]  pravastatin (PRAVACHOL) 40 MG tablet Take 1 tablet (40 mg total) by mouth every evening. 07/23/11 11/06/11  Minna Merritts, MD    Allergies Penicillins  Family History  Problem Relation Age of Onset  . Cancer Mother        died @ 64  . Stroke Father        died @ 71 or pna    Social History Social History   Tobacco Use  . Smoking status: Never Smoker  . Smokeless tobacco: Never Used  Substance Use Topics  . Alcohol use: Yes    Comment: "very seldom" alcoholic beverage  . Drug use: No    Review of Systems  Constitutional: No fever/chills Eyes: No visual changes. ENT: No sore throat. Cardiovascular: Denies chest pain. Respiratory: Denies shortness of breath. Gastrointestinal: No abdominal pain.  No nausea, no vomiting.  No diarrhea.  No constipation. Genitourinary: Negative for dysuria. Musculoskeletal: Negative for back pain.  Positive for fall. Skin: Negative for rash.  Positive for skin tear. Neurological: Negative for headaches, focal weakness or numbness.  ____________________________________________   PHYSICAL EXAM:  VITAL SIGNS: ED Triage Vitals [07/19/19 0849]  Enc Vitals Group     BP      Pulse      Resp      Temp      Temp src      SpO2 97 %     Weight      Height      Head Circumference      Peak Flow      Pain Score      Pain Loc      Pain Edu?      Excl. in [place]?     Constitutional: Alert and oriented to person, but not place or time. Eyes: Conjunctivae are normal. Head: Atraumatic. Nose: No congestion/rhinnorhea. Mouth/Throat: Mucous membranes are moist. Neck: Normal ROM, no midline tenderness. Cardiovascular: Normal rate, regular rhythm. Grossly normal heart sounds. Respiratory: Normal respiratory effort.  No  retractions. Lungs CTAB. Gastrointestinal: Soft and nontender. No distention. Genitourinary: deferred Musculoskeletal: No lower extremity tenderness nor edema. Neurologic:  Normal speech and language. No gross focal neurologic deficits are appreciated. Skin: Skin tears noted to right elbow and right wrist with no active bleeding.  Ecchymosis noted to right elbow.  Diffuse tenderness to left hip, otherwise no significant extremity bony tenderness. Psychiatric: Mood and affect are normal. Speech and behavior are  normal.  ____________________________________________   LABS (all labs ordered are listed, but only abnormal results are displayed)  Labs Reviewed  CBC - Abnormal; Notable for the following components:      Result Value   WBC 14.0 (*)    RBC 3.96 (*)    Hemoglobin 12.0 (*)    HCT 36.5 (*)    All other components within normal limits  URINALYSIS, COMPLETE (UACMP) WITH MICROSCOPIC - Abnormal; Notable for the following components:   Color, Urine YELLOW (*)    APPearance CLEAR (*)    All other components within normal limits  BASIC METABOLIC PANEL - Abnormal; Notable for the following components:   Glucose, Bld 117 (*)    BUN 28 (*)    Calcium 8.8 (*)    GFR calc non Af Amer 55 (*)    All other components within normal limits  TROPONIN I (HIGH SENSITIVITY) - Abnormal; Notable for the following components:   Troponin I (High Sensitivity) 19 (*)    All other components within normal limits  CK  CBG MONITORING, ED  TROPONIN I (HIGH SENSITIVITY)   ____________________________________________  EKG  ED ECG REPORT I, Chesley Noon, the attending physician, personally viewed and interpreted this ECG.   Date: 07/19/2019  EKG Time: 8:54  Rate: 73  Rhythm: normal sinus rhythm, PAC's noted  Axis: Normal  Intervals:right bundle branch block  ST&T Change: None   PROCEDURES  Procedure(s) performed (including Critical Care):  Procedures    ____________________________________________   INITIAL IMPRESSION / ASSESSMENT AND PLAN / ED COURSE       85 year old male presents to the ED following unwitnessed fall at his nursing facility, noted to have pain with movement of lower extremities prior to arrival.  Exam reveals tenderness to palpation over his left hip as well as significant skin tear to his right elbow.  CT head and C-spine were negative for acute process and plain films of his left hip and right elbow were negative for acute process.  EKG shows no evidence of arrhythmia or ischemia, troponins are stable, doubt ACS or arrhythmia as etiology of his fall.  Remainder of labs are unremarkable, no evidence of infectious process.  Patient does appear confused here, will discuss with nursing facility.  Case discussed with nursing staff at patient's facility, who state he has dealt with episodes of confusion for some time and this is not unusual for him.  Doubt acute stroke given his nonfocal neurologic exam.  Nursing staff reports additional pain in the area of his right hip and requests x-rays of this as well as his chest.  X-rays of right femur and chest are negative for acute process.  He is appropriate for discharge back to nursing facility.      ____________________________________________   FINAL CLINICAL IMPRESSION(S) / ED DIAGNOSES  Final diagnoses:  Fall, initial encounter  Contusion of left hip, initial encounter     ED Discharge Orders    None       Note:  This document was prepared using Dragon voice recognition software and may include unintentional dictation errors.   Chesley Noon, MD 07/19/19 1531

## 2019-07-19 NOTE — ED Notes (Signed)
Spoke to pt daughter, she is reporting that pt has been confused x3 days to situation, place and time.

## 2019-07-19 NOTE — ED Triage Notes (Addendum)
Pt had unwitnessed fall. From [facility]. Unsure how long pt was down. Found laying on top of walker and was laying on top of walker when EMS arrived. Skin tear and bruising to left elbow. Bruise to left arm. Mild shortening and external rotation right leg but able to raise leg without pain; pain to right hip on palpation. Bruise to left outer hip. Pt c/o pain to head. Just finished abx for something but pt unable to report what.

## 2019-07-19 NOTE — ED Notes (Signed)
Pt cleaned after in and out cath. Small BM noted no urine in depends. New depends applied and medicated powder ordered for pts groin Skin is significantly irritated with erythema noted bilaterally, skin breakdown noted bilaterally and yellow weeping from open blistering in left groin.

## 2019-07-19 NOTE — ED Notes (Signed)
Pt unable to sign discharge related to mental status.

## 2019-07-19 NOTE — ED Notes (Signed)
Pt has returned from radiology and placed back in monitor. Bed locked and in lowest position and pt in NAD at this time.

## 2019-07-19 NOTE — ED Notes (Signed)
Patient transported to X-ray 

## 2019-09-14 DEATH — deceased
